# Patient Record
Sex: Female | Born: 1970 | Hispanic: Yes | Marital: Married | Smoking: Never smoker
Health system: Southern US, Community
[De-identification: ages and names within clinical notes are randomized; demographics above are authoritative.]

## PROBLEM LIST (undated history)

## (undated) DIAGNOSIS — Z83719 Family history of colon polyps, unspecified: Secondary | ICD-10-CM

## (undated) DIAGNOSIS — Z8371 Family history of colonic polyps: Secondary | ICD-10-CM

## (undated) DIAGNOSIS — B019 Varicella without complication: Secondary | ICD-10-CM

## (undated) DIAGNOSIS — E079 Disorder of thyroid, unspecified: Secondary | ICD-10-CM

## (undated) HISTORY — DX: Family history of colon polyps, unspecified: Z83.719

## (undated) HISTORY — PX: PELVIC LAPAROSCOPY: SHX162

## (undated) HISTORY — DX: Disorder of thyroid, unspecified: E07.9

## (undated) HISTORY — PX: OTHER SURGICAL HISTORY: SHX169

## (undated) HISTORY — DX: Family history of colonic polyps: Z83.71

## (undated) HISTORY — DX: Varicella without complication: B01.9

---

## 2001-05-21 HISTORY — PX: OTHER SURGICAL HISTORY: SHX169

## 2020-03-01 ENCOUNTER — Telehealth: Payer: Self-pay | Admitting: Internal Medicine

## 2020-03-01 NOTE — Telephone Encounter (Signed)
That is okay, please  be sure she knows needs a gynecologist

## 2020-03-01 NOTE — Telephone Encounter (Signed)
Please advise- you have seen her husband once back in June 2021.

## 2020-03-01 NOTE — Telephone Encounter (Signed)
States her husband Rayvon Char is your patient. She is wondering if she could see you

## 2020-03-24 DIAGNOSIS — Z23 Encounter for immunization: Secondary | ICD-10-CM | POA: Diagnosis not present

## 2020-04-03 DIAGNOSIS — Z20822 Contact with and (suspected) exposure to covid-19: Secondary | ICD-10-CM | POA: Diagnosis not present

## 2020-04-19 ENCOUNTER — Ambulatory Visit (INDEPENDENT_AMBULATORY_CARE_PROVIDER_SITE_OTHER): Payer: BC Managed Care – PPO | Admitting: Internal Medicine

## 2020-04-19 ENCOUNTER — Other Ambulatory Visit: Payer: Self-pay

## 2020-04-19 ENCOUNTER — Encounter: Payer: Self-pay | Admitting: Internal Medicine

## 2020-04-19 VITALS — BP 100/64 | HR 63 | Temp 98.3°F | Ht 69.5 in | Wt 152.8 lb

## 2020-04-19 DIAGNOSIS — Z0000 Encounter for general adult medical examination without abnormal findings: Secondary | ICD-10-CM | POA: Diagnosis not present

## 2020-04-19 DIAGNOSIS — Z23 Encounter for immunization: Secondary | ICD-10-CM | POA: Diagnosis not present

## 2020-04-19 DIAGNOSIS — Z01419 Encounter for gynecological examination (general) (routine) without abnormal findings: Secondary | ICD-10-CM

## 2020-04-19 DIAGNOSIS — Z1231 Encounter for screening mammogram for malignant neoplasm of breast: Secondary | ICD-10-CM

## 2020-04-19 DIAGNOSIS — E010 Iodine-deficiency related diffuse (endemic) goiter: Secondary | ICD-10-CM

## 2020-04-19 LAB — COMPREHENSIVE METABOLIC PANEL
ALT: 9 U/L (ref 0–35)
AST: 14 U/L (ref 0–37)
Albumin: 4.5 g/dL (ref 3.5–5.2)
Alkaline Phosphatase: 46 U/L (ref 39–117)
BUN: 14 mg/dL (ref 6–23)
CO2: 30 mEq/L (ref 19–32)
Calcium: 9.3 mg/dL (ref 8.4–10.5)
Chloride: 103 mEq/L (ref 96–112)
Creatinine, Ser: 0.77 mg/dL (ref 0.40–1.20)
GFR: 91.76 mL/min (ref >60.00)
Glucose, Bld: 80 mg/dL (ref 70–99)
Potassium: 4.1 mEq/L (ref 3.5–5.1)
Sodium: 138 mEq/L (ref 135–145)
Total Bilirubin: 0.7 mg/dL (ref 0.2–1.2)
Total Protein: 6.8 g/dL (ref 6.0–8.3)

## 2020-04-19 LAB — CBC WITH DIFFERENTIAL/PLATELET
Basophils Absolute: 0 K/uL (ref 0.0–0.1)
Basophils Relative: 0.9 % (ref 0.0–3.0)
Eosinophils Absolute: 0.3 K/uL (ref 0.0–0.7)
Eosinophils Relative: 5.1 % — ABNORMAL HIGH (ref 0.0–5.0)
HCT: 39.6 % (ref 36.0–46.0)
Hemoglobin: 13.1 g/dL (ref 12.0–15.0)
Lymphocytes Relative: 31 % (ref 12.0–46.0)
Lymphs Abs: 1.6 K/uL (ref 0.7–4.0)
MCHC: 33 g/dL (ref 30.0–36.0)
MCV: 92.3 fl (ref 78.0–100.0)
Monocytes Absolute: 0.4 K/uL (ref 0.1–1.0)
Monocytes Relative: 7.6 % (ref 3.0–12.0)
Neutro Abs: 2.9 K/uL (ref 1.4–7.7)
Neutrophils Relative %: 55.4 % (ref 43.0–77.0)
Platelets: 228 K/uL (ref 150.0–400.0)
RBC: 4.29 Mil/uL (ref 3.87–5.11)
RDW: 12.9 % (ref 11.5–15.5)
WBC: 5.3 K/uL (ref 4.0–10.5)

## 2020-04-19 LAB — LIPID PANEL
Cholesterol: 165 mg/dL (ref 0–200)
HDL: 71.7 mg/dL
LDL Cholesterol: 83 mg/dL (ref 0–99)
NonHDL: 93.78
Total CHOL/HDL Ratio: 2
Triglycerides: 56 mg/dL (ref 0.0–149.0)
VLDL: 11.2 mg/dL (ref 0.0–40.0)

## 2020-04-19 LAB — TSH: TSH: 0.71 u[IU]/mL (ref 0.35–4.50)

## 2020-04-19 NOTE — Patient Instructions (Addendum)
We are referring you to Dr. Edward Jolly   GO TO THE LAB : Get the blood work     GO TO THE FRONT DESK, PLEASE SCHEDULE YOUR APPOINTMENTS Come back for physical exam in 1 year   STOP BY THE FIRST FLOOR: You can schedule your mammogram

## 2020-04-19 NOTE — Progress Notes (Signed)
   Subjective:    Patient ID: Ruth Tucker, female    DOB: 1970-06-28, 49 y.o.   MRN: [id number]  DOS:  04/19/2020 Type of visit - description: New patient, CPX. Feels great, no major concerns.   Review of Systems  A 14 point review of systems is negative    Past Medical History:  Diagnosis Date  . FH: colon polyps   . Varicella     Past Surgical History:  Procedure Laterality Date  . adenitis mesenterica, 3 laparoscopies     3 surgeries ([place])  . CESAREAN SECTION     2005 and 2008  . uterine polyp removal ([place])      Allergies as of 04/19/2020   No Known Allergies     Medication List       Accurate as of April 19, 2020 11:59 PM. If you have any questions, ask your nurse or doctor.        MULTI-VITAMIN DAILY PO Take by mouth.   OSTEO BI-FLEX REGULAR STRENGTH PO Take by mouth in the morning and at bedtime.   PROBIOTIC-10 PO Take by mouth.          Objective:   Physical Exam BP 100/64 (BP Location: Right Arm, Patient Position: Sitting, Cuff Size: Large)   Pulse 63   Temp 98.3 F (36.8 C) (Oral)   Ht 5' 9.5" (1.765 m)   Wt 152 lb 12.8 oz (69.3 kg)   SpO2 100%   BMI 22.24 kg/m  General: Well developed, NAD, BMI noted Neck: Slightly increased thyroid size?  Not nodular, not tender. HEENT:  Normocephalic . Face symmetric, atraumatic Lungs:  CTA B Normal respiratory effort, no intercostal retractions, no accessory muscle use. Heart: RRR,  no murmur.  Abdomen:  Not distended, soft, non-tender. No rebound or rigidity.   Lower extremities: no pretibial edema bilaterally  Skin: Exposed areas without rash. Not pale. Not jaundice Neurologic:  alert & oriented X3.  Speech normal, gait appropriate for age and unassisted Strength symmetric and appropriate for age.  Psych: Cognition and judgment appear intact.  Cooperative with normal attention span and concentration.  Behavior appropriate. No anxious or depressed appearing.       Assessment    Assessment (new patient 03-2020) Healthy, not on BCP  Plan: New patient, here for CPX. Thyromegaly on exam?:  Check ultrasound Social: Originally from [place], has lived in many countries, moved to [place] 2020 with her family RTC 1 year     This visit occurred during the [employer] public health emergency.  Safety protocols were in place, including screening questions prior to the visit, additional usage of staff PPE, and extensive cleaning of exam room while observing appropriate contact time as indicated for disinfecting solutions.

## 2020-04-20 ENCOUNTER — Encounter: Payer: Self-pay | Admitting: Internal Medicine

## 2020-04-20 DIAGNOSIS — Z0000 Encounter for general adult medical examination without abnormal findings: Secondary | ICD-10-CM | POA: Insufficient documentation

## 2020-04-20 NOTE — Assessment & Plan Note (Signed)
Tdap: Today Had COVID vaccination including a booster Had a flu shot Refer to gynecology, Dr. Edward Jolly, order mammogram (last MMG 12-2018) CCS: Had colonoscopy in 2016 while she was living in [place], it was done after infection (colitis?),  Was told no polyps.  She has 1 family member with colon cancer (aunt).  Reassess next year Labs: CMP, CBC, FLP, TSH Lifestyle is very healthy, exercise 5 times a week

## 2020-04-22 ENCOUNTER — Ambulatory Visit (HOSPITAL_BASED_OUTPATIENT_CLINIC_OR_DEPARTMENT_OTHER)
Admission: RE | Admit: 2020-04-22 | Discharge: 2020-04-22 | Disposition: A | Discharge status: Home / Self Care | Payer: BC Managed Care – PPO | Source: Ambulatory Visit | Attending: Internal Medicine | Admitting: Internal Medicine

## 2020-04-22 ENCOUNTER — Other Ambulatory Visit: Payer: Self-pay

## 2020-04-22 DIAGNOSIS — E010 Iodine-deficiency related diffuse (endemic) goiter: Secondary | ICD-10-CM | POA: Insufficient documentation

## 2020-05-30 DIAGNOSIS — U071 COVID-19: Secondary | ICD-10-CM

## 2020-05-30 HISTORY — DX: COVID-19: U07.1

## 2020-07-18 DIAGNOSIS — M9903 Segmental and somatic dysfunction of lumbar region: Secondary | ICD-10-CM | POA: Diagnosis not present

## 2020-07-18 DIAGNOSIS — M9902 Segmental and somatic dysfunction of thoracic region: Secondary | ICD-10-CM | POA: Diagnosis not present

## 2020-07-18 DIAGNOSIS — M5386 Other specified dorsopathies, lumbar region: Secondary | ICD-10-CM | POA: Diagnosis not present

## 2020-07-18 DIAGNOSIS — M9905 Segmental and somatic dysfunction of pelvic region: Secondary | ICD-10-CM | POA: Diagnosis not present

## 2020-07-20 ENCOUNTER — Ambulatory Visit: Payer: BC Managed Care – PPO | Admitting: Obstetrics and Gynecology

## 2020-07-27 DIAGNOSIS — S90852A Superficial foreign body, left foot, initial encounter: Secondary | ICD-10-CM | POA: Diagnosis not present

## 2020-07-27 DIAGNOSIS — M7752 Other enthesopathy of left foot: Secondary | ICD-10-CM | POA: Diagnosis not present

## 2020-07-27 DIAGNOSIS — M19072 Primary osteoarthritis, left ankle and foot: Secondary | ICD-10-CM | POA: Diagnosis not present

## 2020-08-26 ENCOUNTER — Encounter: Payer: Self-pay | Admitting: Internal Medicine

## 2020-09-28 ENCOUNTER — Encounter: Payer: Self-pay | Admitting: Obstetrics and Gynecology

## 2020-09-28 ENCOUNTER — Other Ambulatory Visit: Payer: Self-pay

## 2020-09-28 ENCOUNTER — Ambulatory Visit (INDEPENDENT_AMBULATORY_CARE_PROVIDER_SITE_OTHER): Payer: BC Managed Care – PPO | Admitting: Obstetrics and Gynecology

## 2020-09-28 VITALS — BP 108/68 | HR 78 | Ht 69.0 in | Wt 159.0 lb

## 2020-09-28 DIAGNOSIS — Z01419 Encounter for gynecological examination (general) (routine) without abnormal findings: Secondary | ICD-10-CM

## 2020-09-28 NOTE — Patient Instructions (Signed)

## 2020-09-28 NOTE — Progress Notes (Signed)
50 y.o. [id number] Married [place] female here for annual exam.    Periods are irregular but does occur once a month. Flow is more strong.  Some hot flashes and not sleeping well for many, many years. Not requesting any treatment.   Sometimes has yeast infections, increased with particular dietary intake.  Covid in January.  Will do second booster.   Having knee pain.  Dances.   From [place]. Children 47 and 64 yo.  Husband works for [employer] in [place].  PCP: Willow Ora, MD  Patient's last menstrual period was 08/30/2020 (exact date).           Sexually active: Yes.    The current method of family planning is none.    Exercising: Yes.    ballet and BAR Smoker:  no  Health Maintenance: Pap: 01/2019 normal with negative HR HPV in [place] History of abnormal Pap:  no MMG:  01/2019 normal in [place] Colonoscopy:  2012 done to r/o colitis BMD:   n/a  Result  n/a TDaP:  04-19-20 Gardasil:   no HIV: Neg in preg Hep C:Unsure Screening Labs:  PCP   reports that she has never smoked. She has never used smokeless tobacco. She reports current alcohol use of about 4.0 - 6.0 standard drinks of alcohol per week. She reports that she does not use drugs.  Past Medical History:  Diagnosis Date  . COVID 05/30/2020  . FH: colon polyps   . Thyroid disease    hx enlarged thyroid--US normal  . Varicella     Past Surgical History:  Procedure Laterality Date  . adenitis mesenterica, 3 laparoscopies  2003   3 surgeries ([place])--  . CESAREAN SECTION     2005 and 2008  . PELVIC LAPAROSCOPY    . uterine polyp removal ([place])      Current Outpatient Medications  Medication Sig Dispense Refill  . chlorhexidine (PERIDEX) 0.12 % solution SMARTSIG:By Mouth    . Glucosamine-Chondroitin (OSTEO BI-FLEX REGULAR STRENGTH PO) Take by mouth in the morning and at bedtime.    . Multiple Vitamin (MULTI-VITAMIN DAILY PO) Take by mouth.    . Probiotic Product (PROBIOTIC-10 PO) Take by  mouth.     No current facility-administered medications for this visit.    Family History  Problem Relation Age of Onset  . Depression Mother   . Hypertension Mother   . Asthma Sister   . Hypertension Brother   . Hearing loss Son   . Cancer Maternal Grandmother   . Hypertension Maternal Grandmother   . Cancer Maternal Grandfather   . Hypertension Maternal Grandfather   . Cancer Paternal Grandfather   . Hypertension Paternal Grandfather   . Colon cancer Other        aunt   . Breast cancer Other        aunt  . Cancer Other        Ovarian ca  . Ovarian cancer Other   . Colon cancer Maternal Aunt   . Breast cancer Paternal Aunt   . Colon cancer Paternal Aunt   . Hypertension Paternal Grandmother     Review of Systems  All other systems reviewed and are negative.   Exam:   BP 108/68   Pulse 78   Ht 5\' 9"  (1.753 m)   Wt 159 lb (72.1 kg)   LMP 08/30/2020 (Exact Date)   SpO2 99%   BMI 23.48 kg/m     General appearance: alert, cooperative and appears stated age Head: normocephalic, without  obvious abnormality, atraumatic Neck: no adenopathy, supple, symmetrical, trachea midline and thyroid normal to inspection and palpation Lungs: clear to auscultation bilaterally Breasts: normal appearance, no masses or tenderness, No nipple retraction or dimpling, No nipple discharge or bleeding, No axillary adenopathy Heart: regular rate and rhythm Abdomen: soft, non-tender; no masses, no organomegaly Extremities: extremities normal, atraumatic, no cyanosis or edema Skin: skin color, texture, turgor normal. No rashes or lesions Lymph nodes: cervical, supraclavicular, and axillary nodes normal. Neurologic: grossly normal  Pelvic: External genitalia:  no lesions              No abnormal inguinal nodes palpated.              Urethra:  normal appearing urethra with no masses, tenderness or lesions              Bartholins and Skenes: normal                 Vagina: normal appearing  vagina with normal color and discharge, no lesions              Cervix: no lesions              Pap taken: No. Bimanual Exam:  Uterus:  normal size, contour, position, consistency, mobility, non-tender              Adnexa: no mass, fullness, tenderness              Rectal exam: Yes.  .  Confirms.              Anus:  normal sphincter tone, no lesions  Chaperone was present for exam.  Assessment:   Well woman visit with normal exam.  Plan: Mammogram screening recommended.  Contact information for locations in [place] sent to the patient through My Chart following the visit.  Self breast awareness reviewed. Pap and HR HPV as above. Guidelines for Calcium, Vitamin D, regular exercise program including cardiovascular and weight bearing exercise.   Follow up annually and prn.

## 2020-09-29 ENCOUNTER — Encounter: Payer: Self-pay | Admitting: Obstetrics and Gynecology

## 2020-09-29 NOTE — Telephone Encounter (Signed)
Hello Ruth Tucker,   It was pleasure to meet you yesterday!  Welcome to [facility].   I am reaching out to give you information where you can schedule your mammogram in [place].  Both facilities are located on [address] near [facility]. - [facility]:  [phone].  [facility]:  [phone].  Have a great day.  Conley Simmonds, MD

## 2020-11-25 DIAGNOSIS — M9902 Segmental and somatic dysfunction of thoracic region: Secondary | ICD-10-CM | POA: Diagnosis not present

## 2020-11-25 DIAGNOSIS — M9903 Segmental and somatic dysfunction of lumbar region: Secondary | ICD-10-CM | POA: Diagnosis not present

## 2020-11-25 DIAGNOSIS — M5386 Other specified dorsopathies, lumbar region: Secondary | ICD-10-CM | POA: Diagnosis not present

## 2020-11-25 DIAGNOSIS — M9905 Segmental and somatic dysfunction of pelvic region: Secondary | ICD-10-CM | POA: Diagnosis not present

## 2020-11-28 DIAGNOSIS — M9905 Segmental and somatic dysfunction of pelvic region: Secondary | ICD-10-CM | POA: Diagnosis not present

## 2020-11-28 DIAGNOSIS — M9902 Segmental and somatic dysfunction of thoracic region: Secondary | ICD-10-CM | POA: Diagnosis not present

## 2020-11-28 DIAGNOSIS — M9903 Segmental and somatic dysfunction of lumbar region: Secondary | ICD-10-CM | POA: Diagnosis not present

## 2020-11-28 DIAGNOSIS — M5386 Other specified dorsopathies, lumbar region: Secondary | ICD-10-CM | POA: Diagnosis not present

## 2020-12-19 DIAGNOSIS — M5386 Other specified dorsopathies, lumbar region: Secondary | ICD-10-CM | POA: Diagnosis not present

## 2020-12-19 DIAGNOSIS — M9902 Segmental and somatic dysfunction of thoracic region: Secondary | ICD-10-CM | POA: Diagnosis not present

## 2020-12-19 DIAGNOSIS — M9905 Segmental and somatic dysfunction of pelvic region: Secondary | ICD-10-CM | POA: Diagnosis not present

## 2020-12-19 DIAGNOSIS — M9903 Segmental and somatic dysfunction of lumbar region: Secondary | ICD-10-CM | POA: Diagnosis not present

## 2020-12-21 DIAGNOSIS — M5386 Other specified dorsopathies, lumbar region: Secondary | ICD-10-CM | POA: Diagnosis not present

## 2020-12-21 DIAGNOSIS — M9902 Segmental and somatic dysfunction of thoracic region: Secondary | ICD-10-CM | POA: Diagnosis not present

## 2020-12-21 DIAGNOSIS — M9903 Segmental and somatic dysfunction of lumbar region: Secondary | ICD-10-CM | POA: Diagnosis not present

## 2020-12-21 DIAGNOSIS — M9905 Segmental and somatic dysfunction of pelvic region: Secondary | ICD-10-CM | POA: Diagnosis not present

## 2021-01-31 DIAGNOSIS — M9902 Segmental and somatic dysfunction of thoracic region: Secondary | ICD-10-CM | POA: Diagnosis not present

## 2021-01-31 DIAGNOSIS — M9903 Segmental and somatic dysfunction of lumbar region: Secondary | ICD-10-CM | POA: Diagnosis not present

## 2021-01-31 DIAGNOSIS — M5386 Other specified dorsopathies, lumbar region: Secondary | ICD-10-CM | POA: Diagnosis not present

## 2021-01-31 DIAGNOSIS — M9905 Segmental and somatic dysfunction of pelvic region: Secondary | ICD-10-CM | POA: Diagnosis not present

## 2021-02-03 DIAGNOSIS — M9903 Segmental and somatic dysfunction of lumbar region: Secondary | ICD-10-CM | POA: Diagnosis not present

## 2021-02-03 DIAGNOSIS — M9902 Segmental and somatic dysfunction of thoracic region: Secondary | ICD-10-CM | POA: Diagnosis not present

## 2021-02-03 DIAGNOSIS — M5386 Other specified dorsopathies, lumbar region: Secondary | ICD-10-CM | POA: Diagnosis not present

## 2021-02-03 DIAGNOSIS — M9905 Segmental and somatic dysfunction of pelvic region: Secondary | ICD-10-CM | POA: Diagnosis not present

## 2021-04-14 DIAGNOSIS — M5386 Other specified dorsopathies, lumbar region: Secondary | ICD-10-CM | POA: Diagnosis not present

## 2021-04-14 DIAGNOSIS — M9903 Segmental and somatic dysfunction of lumbar region: Secondary | ICD-10-CM | POA: Diagnosis not present

## 2021-04-14 DIAGNOSIS — M9902 Segmental and somatic dysfunction of thoracic region: Secondary | ICD-10-CM | POA: Diagnosis not present

## 2021-04-14 DIAGNOSIS — M9905 Segmental and somatic dysfunction of pelvic region: Secondary | ICD-10-CM | POA: Diagnosis not present

## 2021-04-18 DIAGNOSIS — B009 Herpesviral infection, unspecified: Secondary | ICD-10-CM | POA: Insufficient documentation

## 2021-04-20 ENCOUNTER — Encounter: Payer: Self-pay | Admitting: Internal Medicine

## 2021-04-20 ENCOUNTER — Ambulatory Visit (INDEPENDENT_AMBULATORY_CARE_PROVIDER_SITE_OTHER): Payer: BC Managed Care – PPO | Admitting: Internal Medicine

## 2021-04-20 VITALS — BP 106/68 | HR 65 | Temp 98.0°F | Resp 16 | Ht 69.0 in | Wt 153.2 lb

## 2021-04-20 DIAGNOSIS — Z1231 Encounter for screening mammogram for malignant neoplasm of breast: Secondary | ICD-10-CM | POA: Diagnosis not present

## 2021-04-20 DIAGNOSIS — Z09 Encounter for follow-up examination after completed treatment for conditions other than malignant neoplasm: Secondary | ICD-10-CM | POA: Insufficient documentation

## 2021-04-20 DIAGNOSIS — Z0000 Encounter for general adult medical examination without abnormal findings: Secondary | ICD-10-CM | POA: Diagnosis not present

## 2021-04-20 DIAGNOSIS — Z1159 Encounter for screening for other viral diseases: Secondary | ICD-10-CM

## 2021-04-20 DIAGNOSIS — K5909 Other constipation: Secondary | ICD-10-CM

## 2021-04-20 DIAGNOSIS — Z114 Encounter for screening for human immunodeficiency virus [HIV]: Secondary | ICD-10-CM

## 2021-04-20 LAB — CBC WITH DIFFERENTIAL/PLATELET
Basophils Absolute: 0.1 10*3/uL (ref 0.0–0.1)
Basophils Relative: 1.3 % (ref 0.0–3.0)
Eosinophils Absolute: 0.2 10*3/uL (ref 0.0–0.7)
Eosinophils Relative: 4.4 % (ref 0.0–5.0)
HCT: 38.4 % (ref 36.0–46.0)
Hemoglobin: 12.6 g/dL (ref 12.0–15.0)
Lymphocytes Relative: 35.7 % (ref 12.0–46.0)
Lymphs Abs: 1.6 10*3/uL (ref 0.7–4.0)
MCHC: 32.8 g/dL (ref 30.0–36.0)
MCV: 92.4 fl (ref 78.0–100.0)
Monocytes Absolute: 0.4 10*3/uL (ref 0.1–1.0)
Monocytes Relative: 8.9 % (ref 3.0–12.0)
Neutro Abs: 2.2 10*3/uL (ref 1.4–7.7)
Neutrophils Relative %: 49.7 % (ref 43.0–77.0)
Platelets: 234 10*3/uL (ref 150.0–400.0)
RBC: 4.16 Mil/uL (ref 3.87–5.11)
RDW: 12.4 % (ref 11.5–15.5)
WBC: 4.4 10*3/uL (ref 4.0–10.5)

## 2021-04-20 LAB — LIPID PANEL
Cholesterol: 190 mg/dL (ref 0–200)
HDL: 76.3 mg/dL (ref >39.00)
LDL Cholesterol: 106 mg/dL — ABNORMAL HIGH (ref 0–99)
NonHDL: 113.4
Total CHOL/HDL Ratio: 2
Triglycerides: 36 mg/dL (ref 0.0–149.0)
VLDL: 7.2 mg/dL (ref 0.0–40.0)

## 2021-04-20 LAB — COMPREHENSIVE METABOLIC PANEL
ALT: 10 U/L (ref 0–35)
AST: 18 U/L (ref 0–37)
Albumin: 4.5 g/dL (ref 3.5–5.2)
Alkaline Phosphatase: 41 U/L (ref 39–117)
BUN: 11 mg/dL (ref 6–23)
CO2: 27 mEq/L (ref 19–32)
Calcium: 9.3 mg/dL (ref 8.4–10.5)
Chloride: 103 mEq/L (ref 96–112)
Creatinine, Ser: 0.8 mg/dL (ref 0.40–1.20)
GFR: 87.03 mL/min (ref >60.00)
Glucose, Bld: 84 mg/dL (ref 70–99)
Potassium: 4.2 mEq/L (ref 3.5–5.1)
Sodium: 136 mEq/L (ref 135–145)
Total Bilirubin: 0.6 mg/dL (ref 0.2–1.2)
Total Protein: 6.8 g/dL (ref 6.0–8.3)

## 2021-04-20 LAB — HIV ANTIBODY (ROUTINE TESTING W REFLEX): HIV 1&2 Ab, 4th Generation: NONREACTIVE

## 2021-04-20 LAB — TSH: TSH: 0.81 u[IU]/mL (ref 0.35–5.50)

## 2021-04-20 LAB — HEPATITIS C ANTIBODY
Hepatitis C Ab: NONREACTIVE
SIGNAL TO CUT-OFF: 0.02 (ref <1.00)

## 2021-04-20 NOTE — Assessment & Plan Note (Signed)
Here for CPX. Chronic constipation: As described above, refer to GI. LLQ abdominal pain: Occasionally has LLQ abdominal discomfort, not related to her periods, exam is benign, recommend observation, if sxs persist, pelvic ultrasound? RTC 1 year

## 2021-04-20 NOTE — Progress Notes (Signed)
Subjective:    Patient ID: Ruth Tucker, female    DOB: 1971/03/20, 50 y.o.   MRN: [id number]  DOS:  04/20/2021 Type of visit - description: cpx  Here for CPX. She has a history of chronic constipation, takes OTC fiber , it used to work better for her. Currently having 3 BMs weekly. Denies nausea, vomiting.  No diarrhea.  No blood in the stools. Occasionally has ill-defined LLQ abdominal pain. Occasionally feels bloated.  Also, she remains very active, having aches and pains after exercise.  Review of Systems  Other than above, a 14 point review of systems is negative       Past Medical History:  Diagnosis Date   COVID 05/30/2020   FH: colon polyps    Thyroid disease    hx enlarged thyroid--US normal   Varicella     Past Surgical History:  Procedure Laterality Date   adenitis mesenterica, 3 laparoscopies  2003   3 surgeries ([place])--   CESAREAN SECTION     2005 and 2008   PELVIC LAPAROSCOPY     uterine polyp removal ([place])     Social History   Socioeconomic History   Marital status: Married    Spouse name: Not on file   Number of children: 2   Years of education: Not on file   Highest education level: Not on file  Occupational History   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  Tobacco Use   Smoking status: Never   Smokeless tobacco: Never  Substance and Sexual Activity   Alcohol use: Yes    Alcohol/week: 4.0 - 6.0 standard drinks    Types: 4 - 6 Glasses of wine per week    Comment: moderate   Drug use: Never   Sexual activity: Yes    Birth control/protection: None  Other Topics Concern   Not on file  Social History Narrative   Original from [place], moved to [place] from [place] 2020   Social Determinants of Health   Financial Resource Strain: Not on file  Food Insecurity: Not on file  Transportation Needs: Not on file  Physical Activity: Not on file  Stress: Not on file  Social Connections: Not on file  Intimate Partner Violence: Not on file     Allergies as of 04/20/2021   No Known Allergies      Medication List        Accurate as of April 20, 2021  5:30 PM. If you have any questions, ask your nurse or doctor.          chlorhexidine 0.12 % solution Commonly known as: PERIDEX SMARTSIG:By Mouth   COLLAGEN PO Take by mouth.   MULTI-VITAMIN DAILY PO Take by mouth.   OSTEO BI-FLEX REGULAR STRENGTH PO Take by mouth in the morning and at bedtime.   PROBIOTIC-10 PO Take by mouth.   psyllium 0.52 g capsule Commonly known as: REGULOID Take 0.52 g by mouth daily.           Objective:   Physical Exam BP 106/68 (BP Location: Left Arm, Patient Position: Sitting, Cuff Size: Small)   Pulse 65   Temp 98 F (36.7 C) (Oral)   Resp 16   Ht 5\' 9"  (1.753 m)   Wt 153 lb 4 oz (69.5 kg)   SpO2 98%   BMI 22.63 kg/m  General: Well developed, NAD, BMI noted Neck: No  thyromegaly  HEENT:  Normocephalic . Face symmetric, atraumatic Lungs:  CTA B Normal respiratory effort, no intercostal retractions,  no accessory muscle use. Heart: RRR,  no murmur.  Abdomen:  Not distended, soft, non-tender. No rebound or rigidity.   Lower extremities: no pretibial edema bilaterally Upper extremities: Hands and wrists without synovitis Skin: Exposed areas without rash. Not pale. Not jaundice Neurologic:  alert & oriented X3.  Speech normal, gait appropriate for age and unassisted Strength symmetric and appropriate for age.  Psych: Cognition and judgment appear intact.  Cooperative with normal attention span and concentration.  Behavior appropriate. No anxious or depressed appearing.     Assessment     Assessment (new patient 03-2020) Healthy, not on BCP US Thyroid 04-2020 wnl   PLAN Here for CPX. Chronic constipation: As described above, refer to GI. LLQ abdominal pain: Occasionally has LLQ abdominal discomfort, not related to her periods, exam is benign, recommend observation, if sxs persist, pelvic  ultrasound? RTC 1 year    This visit occurred during the SARS-CoV-2 public health emergency.  Safety protocols were in place, including screening questions prior to the visit, additional usage of staff PPE, and extensive cleaning of exam room while observing appropriate contact time as indicated for disinfecting solutions.

## 2021-04-20 NOTE — Assessment & Plan Note (Signed)
Tdap: 03-2020  COVID vac booster rec Had a flu shot  Saw gyn  Dr. Edward Jolly, she noted  a PAP was done  2020 in [place]  Last MMG 12-2018 in [place] , rx  a  MMG  CCS: Had colonoscopy in 2016 while she was living in [place], it was done after infection (colitis?),  Was told no polyps.  She has 1 family member with colon cancer (aunt).  Today also complains of chronic constipation.  Refer to GI. Labs: CMP, FLP, CBC, TSH, HIV, hep C Lifestyle is very healthy, exercise 5 times a week

## 2021-04-20 NOTE — Patient Instructions (Signed)
Please call the gastroenterology office at [phone] and arrange a office visit with them in regards.  Constipation.   GO TO THE LAB : Get the blood work     GO TO THE FRONT DESK, PLEASE SCHEDULE YOUR APPOINTMENTS Come back for a physical exam in 1 year   STOP BY THE FIRST FLOOR: Schedule a mammogram

## 2021-04-21 ENCOUNTER — Encounter: Payer: Self-pay | Admitting: Internal Medicine

## 2021-04-21 ENCOUNTER — Ambulatory Visit (INDEPENDENT_AMBULATORY_CARE_PROVIDER_SITE_OTHER): Payer: BC Managed Care – PPO | Admitting: Internal Medicine

## 2021-04-21 VITALS — BP 102/62 | HR 62 | Ht 69.0 in | Wt 153.0 lb

## 2021-04-21 DIAGNOSIS — K5900 Constipation, unspecified: Secondary | ICD-10-CM | POA: Diagnosis not present

## 2021-04-21 DIAGNOSIS — R1030 Lower abdominal pain, unspecified: Secondary | ICD-10-CM

## 2021-04-21 DIAGNOSIS — Z1211 Encounter for screening for malignant neoplasm of colon: Secondary | ICD-10-CM | POA: Diagnosis not present

## 2021-04-21 MED ORDER — SUTAB 1479-225-188 MG PO TABS: 1.0000 | ORAL_TABLET | Freq: Once | ORAL | 0 refills | Status: AC

## 2021-04-21 NOTE — Patient Instructions (Signed)
If you are age 48 or older, your body mass index should be between 23-30. Your Body mass index is 22.59 kg/m. If this is out of the aforementioned range listed, please consider follow up with your Primary Care Provider.  If you are age 47 or younger, your body mass index should be between 19-25. Your Body mass index is 22.59 kg/m. If this is out of the aformentioned range listed, please consider follow up with your Primary Care Provider.   ________________________________________________________  The [facility] providers would like to encourage you to use [facility] to communicate with providers for non-urgent requests or questions.  Due to long hold times on the telephone, sending your provider a message by [facility] may be a faster and more efficient way to get a response.  Please allow 48 business hours for a response.  Please remember that this is for non-urgent requests.  _______________________________________________________  Ruth Tucker have been scheduled for a colonoscopy. Please follow written instructions given to you at your visit today.  Please pick up your prep supplies at the pharmacy within the next 1-3 days. If you use inhalers (even only as needed), please bring them with you on the day of your procedure.   Restart fiber  Take Miralax daily - titrate up to achieve one bowel movement a day

## 2021-04-21 NOTE — Progress Notes (Signed)
 Chief Complaint: Constipation  HPI : 50 year old with history of chronic constipation presents for consitpatoin.   She has been having issues with constipation for years. She was taking psyllium husk, which was helping previously. However, more recently her fiber supplement has not been helping with her constipation as much. She is on average having 3 stools per week. Denies having tried anything else for her constipation. Denies laxative use. Has been having issues with abdominal pain that is located in the LLQ. This pain appears to improve after having a BM. On the average a few times per years she has had issues with hemorrhoids. She ususually uses suppositories for hemorrhoids and over a few days her hemorrhoids resolve.  Denies hematochezia, melena. Has fam hx of colon cancer in a maternal aunt. She has had a colonoscopy in the past in 2016 when she was in [place] when she was seen to have colonic inflammation that was treated with antibiotics.  Denies dysphagia, N&V. She has occasional bloating. Denies chest burning, regurgitation.She drinks alcohol twice per week.  She is from [place] originally.   Past Medical History:  Diagnosis Date   COVID 05/30/2020   FH: colon polyps    Thyroid disease    hx enlarged thyroid--US normal   Varicella      Past Surgical History:  Procedure Laterality Date   adenitis mesenterica, 3 laparoscopies  2003   3 surgeries ([place])--   CESAREAN SECTION     2005 and 2008   PELVIC LAPAROSCOPY     uterine polyp removal ([place])     Family History  Problem Relation Age of Onset   Depression Mother    Hypertension Mother    Asthma Sister    Hypertension Brother    Hearing loss Son    Cancer Maternal Grandmother    Hypertension Maternal Grandmother    Cancer Maternal Grandfather    Hypertension Maternal Grandfather    Cancer Paternal Grandfather    Hypertension Paternal Grandfather    Colon cancer Other        aunt    Breast cancer Other         aunt   Cancer Other        Ovarian ca   Ovarian cancer Other    Colon cancer Maternal Aunt    Breast cancer Paternal Aunt    Colon cancer Paternal Aunt    Hypertension Paternal Grandmother    Social History   Tobacco Use   Smoking status: Never   Smokeless tobacco: Never  Substance Use Topics   Alcohol use: Yes    Alcohol/week: 4.0 - 6.0 standard drinks    Types: 4 - 6 Glasses of wine per week    Comment: moderate   Drug use: Never   Current Outpatient Medications  Medication Sig Dispense Refill   chlorhexidine (PERIDEX) 0.12 % solution SMARTSIG:By Mouth     COLLAGEN PO Take by mouth.     Glucosamine-Chondroitin (OSTEO BI-FLEX REGULAR STRENGTH PO) Take by mouth in the morning and at bedtime.     Multiple Vitamin (MULTI-VITAMIN DAILY PO) Take by mouth.     Probiotic Product (PROBIOTIC-10 PO) Take by mouth.     psyllium (REGULOID) 0.52 g capsule Take 0.52 g by mouth daily.     Sodium Sulfate-Mag Sulfate-KCl (SUTAB) 1479-225-188 MG TABS Take 1 kit by mouth once for 1 dose. 24 tablet 0   No current facility-administered medications for this visit.   No Known Allergies   Review of   Systems: All systems reviewed and negative except where noted in HPI.   Physical Exam: BP 102/62   Pulse 62   Ht 5' 9" (1.753 m)   Wt 153 lb (69.4 kg)   BMI 22.59 kg/m  Constitutional: Pleasant,well-developed, female in no acute distress. HEENT: Normocephalic and atraumatic. Conjunctivae are normal. No scleral icterus. Cardiovascular: Normal rate, regular rhythm.  Pulmonary/chest: Effort normal and breath sounds normal. No wheezing, rales or rhonchi. Abdominal: Soft, nondistended, tender in the LLQ. Bowel sounds active throughout. There are no masses palpable. No hepatomegaly. Extremities: No edema Neurological: Alert and oriented to person place and time. Skin: Skin is warm and dry. No rashes noted. Psychiatric: Normal mood and affect. Behavior is normal.  Labs 04/20/21: CBC and CMP  normal. HCV NR. TSH nml.  ASSESSMENT AND PLAN:  Constipation Lower abdominal pain  Colon cancer screening Patient presents with longstanding constipation that appears to be worsening over time. Her constipation used to be adequately controlled by her fiber supplement but has stopped by as responsive to fiber. We will plan to restart her fiber supplement and start her on MiraLAX daily to try and help with her symptoms.  Since the patient is also due for colon cancer screening, we will plan for colonoscopy for further evaluation.  She had a previous colonoscopy done in [place] that reportedly showed some inflammation but was otherwise unremarkable.  She does have some second-degree family members that have colon cancer. - Restart psyllium once a day - Miralax QD. Titrate to achieve 1 BM per day - Colonoscopy [facility] - RTC 2 months. Can consider Linzess in the future  Claire Dorsey, MD  

## 2021-04-24 ENCOUNTER — Ambulatory Visit (HOSPITAL_BASED_OUTPATIENT_CLINIC_OR_DEPARTMENT_OTHER): Payer: BC Managed Care – PPO

## 2021-04-25 ENCOUNTER — Encounter: Payer: Self-pay | Admitting: Internal Medicine

## 2021-04-25 ENCOUNTER — Telehealth: Payer: Self-pay | Admitting: Internal Medicine

## 2021-04-25 IMAGING — US US THYROID
1 series · 14 of 19 positions shown · non-contrast
Comparison: None.

CLINICAL DATA: Thyromegaly

EXAM:
THYROID ULTRASOUND
TECHNIQUE: Ultrasound examination of the thyroid gland and adjacent soft
tissues was performed.

[Series 1: us thyroid · 14 of 19 slices shown]
[im 1/19]
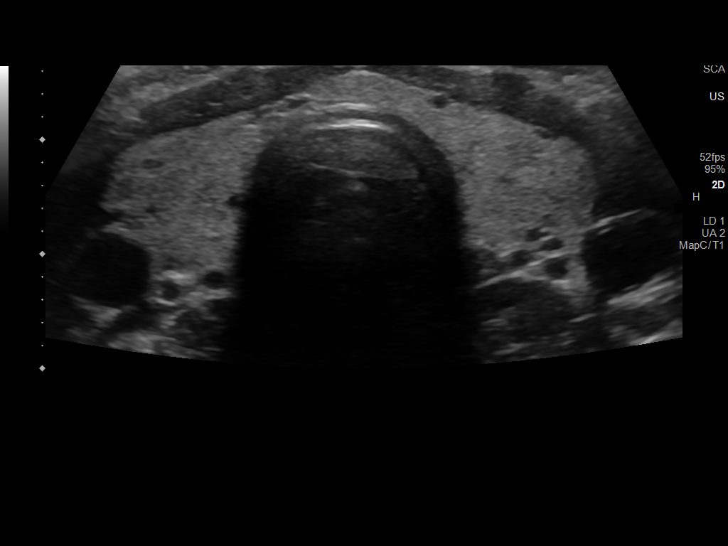
[im 3/19]
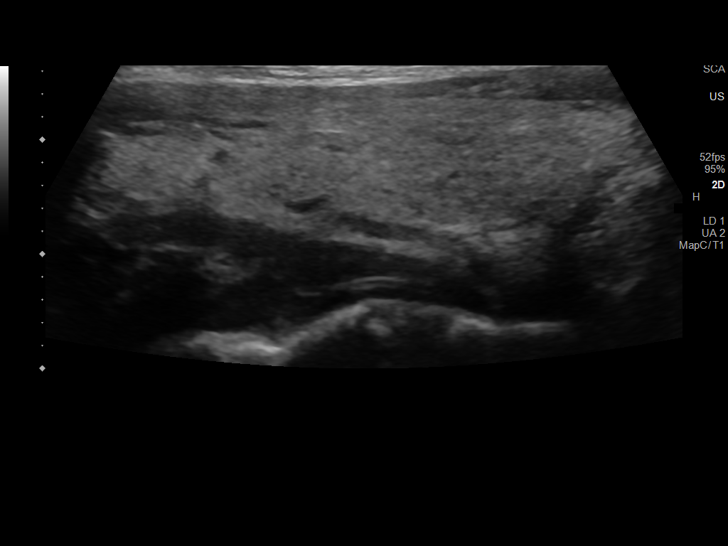
[im 4/19]
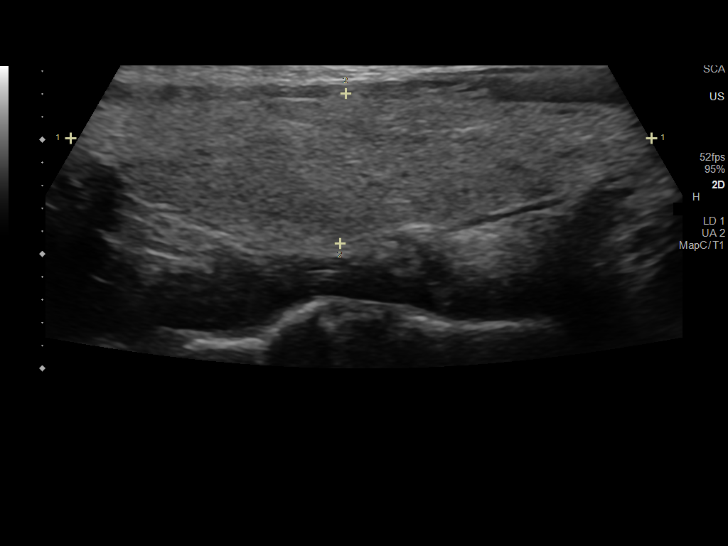
[im 5/19]
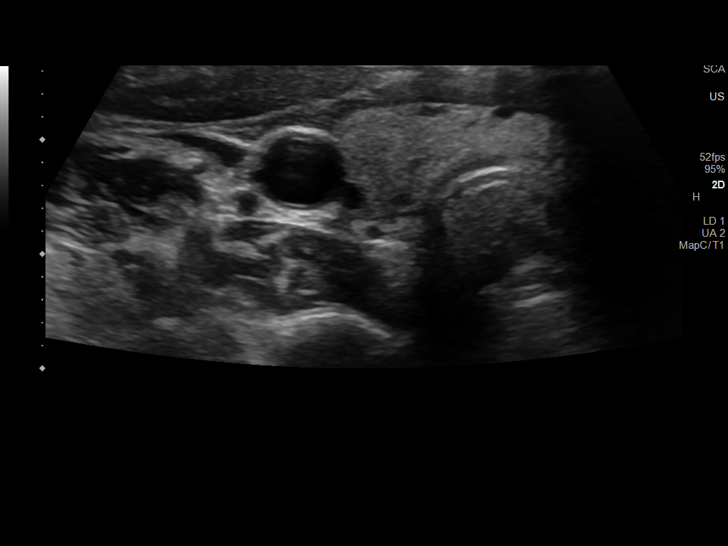
[im 7/19]
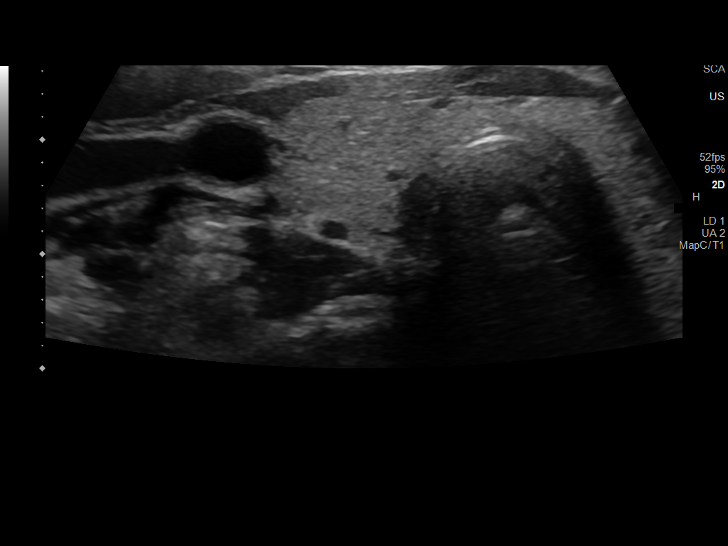
[im 8/19]
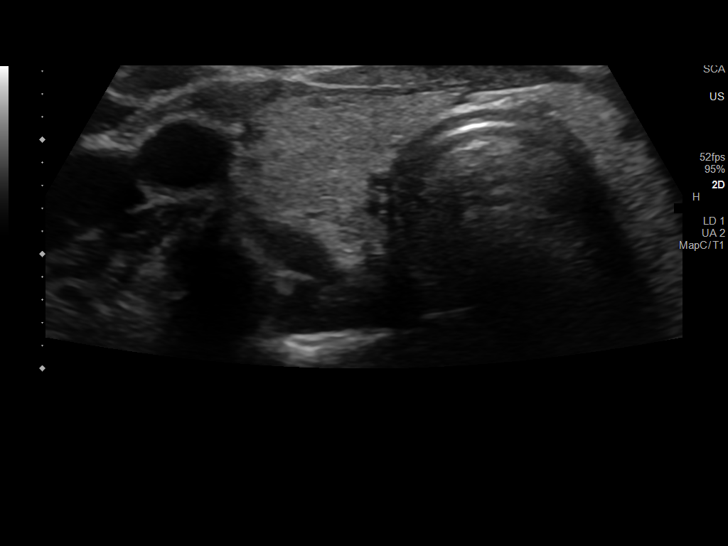
[im 9/19]
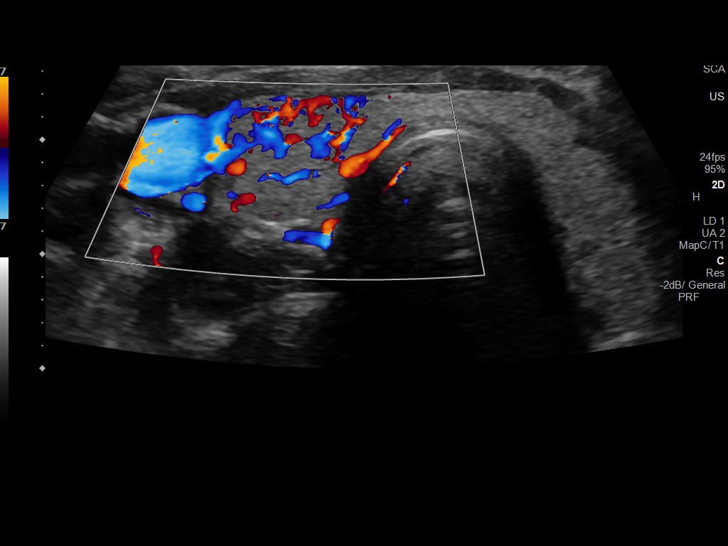
[im 11/19]
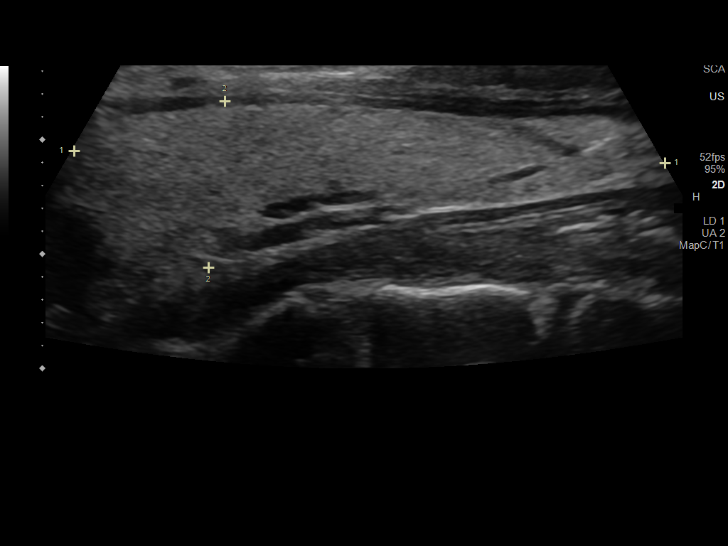
[im 12/19]
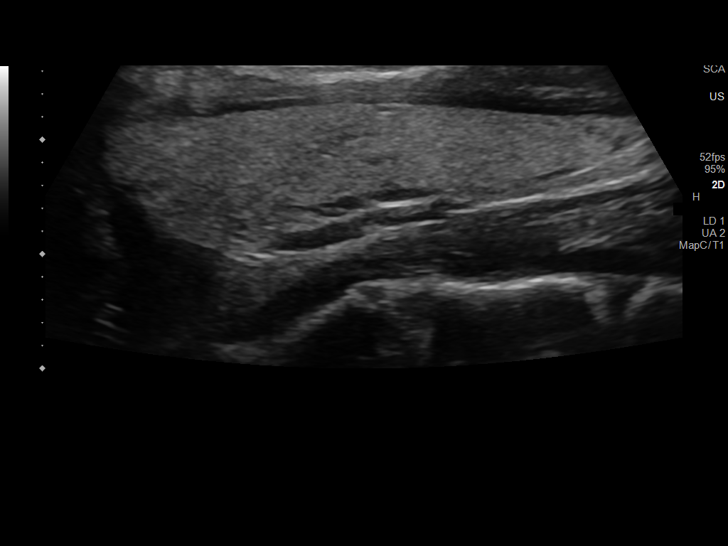
[im 13/19]
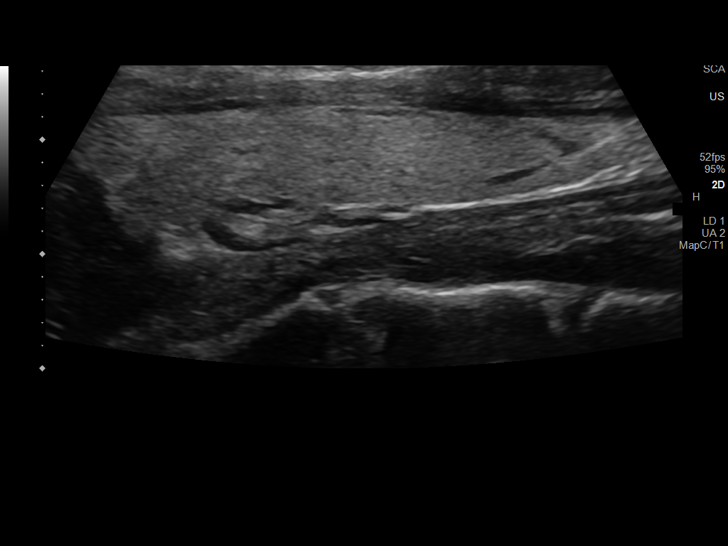
[im 15/19]
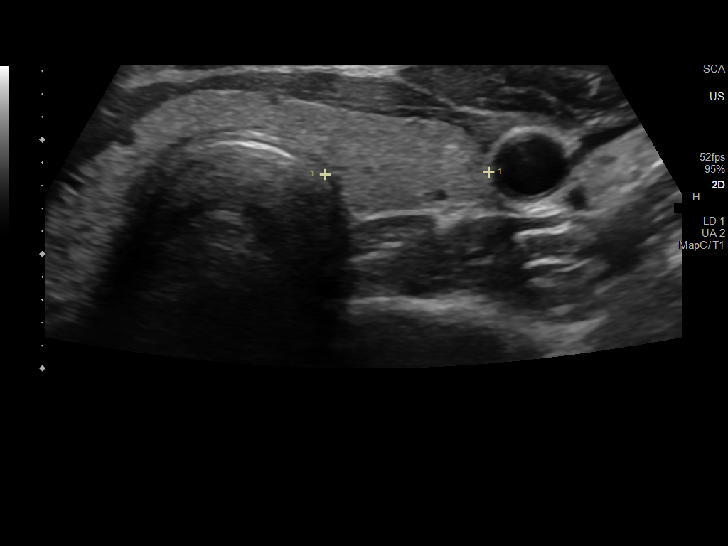
[im 16/19]
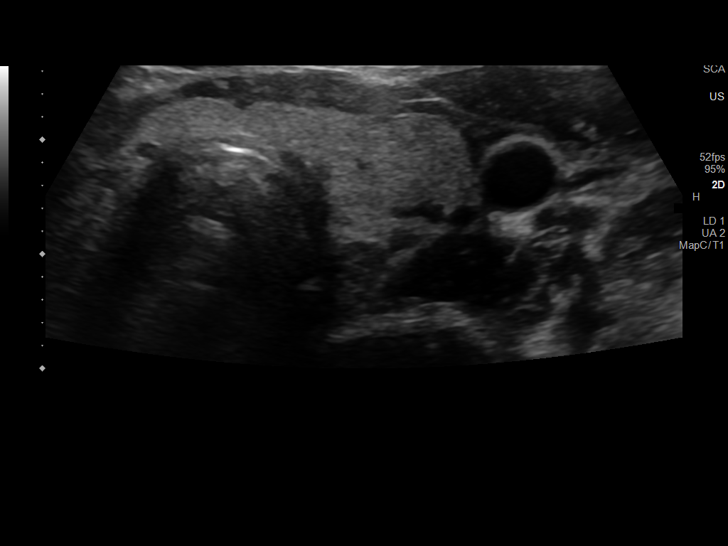
[im 17/19]
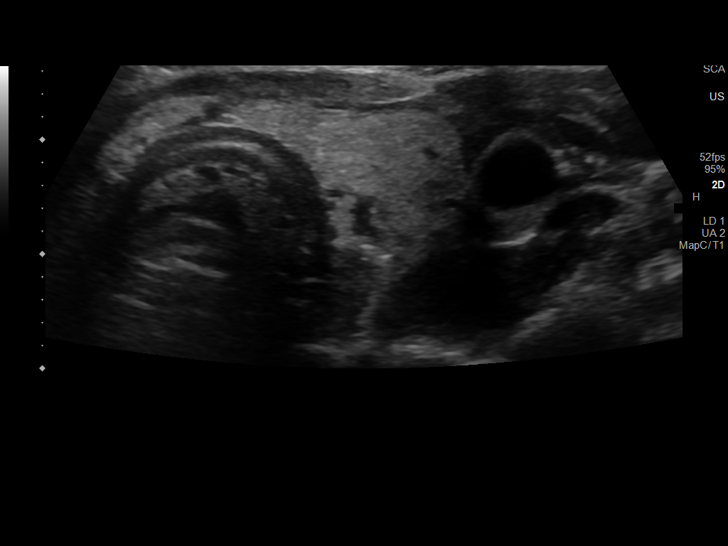
[im 19/19]
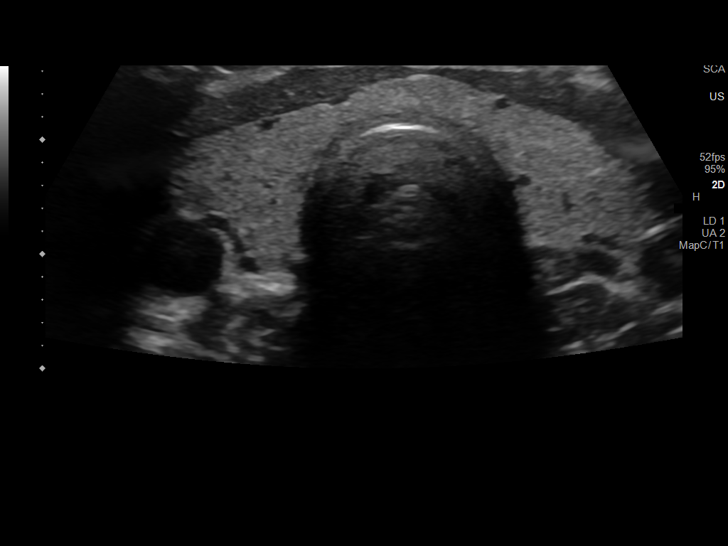

[14 of 19 positions shown; findings below may reference images not displayed]

FINDINGS: Parenchymal Echotexture: Mildly heterogenous

Isthmus: 3.8 mm

Right lobe: 5.1 x 1.3 x 1.2 cm

Left lobe: 5.2 x 1.5 x 1.4 cm

_________________________________________________________

Estimated total number of nodules >/= 1 cm: 0

Number of spongiform nodules >/=  2 cm not described below (TR1): 0

Number of mixed cystic and solid nodules >/= 1.5 cm not described
below (TR2): 0

_________________________________________________________

No discrete nodules are seen within the thyroid gland.
IMPRESSION: Normal thyroid ultrasound

The above is in keeping with the ACR TI-RADS recommendations - [HOSPITAL] 0908;[DATE].

## 2021-04-25 NOTE — Telephone Encounter (Signed)
Patient moved her colonoscopy up to 05/10/21 at 10:30 a.m. with Dr. Leonides Schanz.  She said she was supposed to be contacted by [facility] to help with her prep, but as of yet, she has not heard anything from them.  Can you please let patient know how to proceed?  Thank you.

## 2021-05-01 DIAGNOSIS — Z23 Encounter for immunization: Secondary | ICD-10-CM | POA: Diagnosis not present

## 2021-05-01 NOTE — Telephone Encounter (Signed)
Spoke with [facility] who was going to reach out to patient immediately

## 2021-05-04 ENCOUNTER — Encounter: Payer: Self-pay | Admitting: Internal Medicine

## 2021-05-04 ENCOUNTER — Other Ambulatory Visit: Payer: Self-pay

## 2021-05-04 ENCOUNTER — Other Ambulatory Visit (HOSPITAL_BASED_OUTPATIENT_CLINIC_OR_DEPARTMENT_OTHER): Payer: Self-pay | Admitting: Internal Medicine

## 2021-05-04 ENCOUNTER — Ambulatory Visit (INDEPENDENT_AMBULATORY_CARE_PROVIDER_SITE_OTHER): Payer: BC Managed Care – PPO

## 2021-05-04 DIAGNOSIS — Z1231 Encounter for screening mammogram for malignant neoplasm of breast: Secondary | ICD-10-CM

## 2021-05-05 ENCOUNTER — Other Ambulatory Visit: Payer: Self-pay | Admitting: Internal Medicine

## 2021-05-05 DIAGNOSIS — R928 Other abnormal and inconclusive findings on diagnostic imaging of breast: Secondary | ICD-10-CM

## 2021-05-10 ENCOUNTER — Ambulatory Visit (AMBULATORY_SURGERY_CENTER): Payer: BC Managed Care – PPO | Admitting: Internal Medicine

## 2021-05-10 ENCOUNTER — Other Ambulatory Visit: Payer: Self-pay

## 2021-05-10 ENCOUNTER — Encounter: Payer: Self-pay | Admitting: Internal Medicine

## 2021-05-10 VITALS — BP 96/50 | HR 65 | Temp 97.1°F | Resp 12 | Ht 69.0 in | Wt 153.0 lb

## 2021-05-10 DIAGNOSIS — Z1211 Encounter for screening for malignant neoplasm of colon: Secondary | ICD-10-CM | POA: Diagnosis not present

## 2021-05-10 DIAGNOSIS — K5900 Constipation, unspecified: Secondary | ICD-10-CM

## 2021-05-10 MED ORDER — SODIUM CHLORIDE 0.9 % IV SOLN: 500.0000 mL | Freq: Once | INTRAVENOUS | Status: DC

## 2021-05-10 NOTE — Progress Notes (Signed)
GASTROENTEROLOGY PROCEDURE H&P NOTE   Primary Care Physician: Wanda Plump, MD    Reason for Procedure:   Colon cancer screening  Plan:    Colonoscopy  Patient is appropriate for endoscopic procedure(s) in the ambulatory ([facility]) setting.  The nature of the procedure, as well as the risks, benefits, and alternatives were carefully and thoroughly reviewed with the patient. Ample time for discussion and questions allowed. The patient understood, was satisfied, and agreed to proceed.     HPI: Ruth Tucker is a 50 y.o. female who presents for colonoscopy for evaluation of colon cancer screening .  Patient was most recently seen in the Gastroenterology Clinic on 04/21/21.  No interval change in medical history since that appointment. Please refer to that note for full details regarding GI history and clinical presentation.   Past Medical History:  Diagnosis Date   COVID 05/30/2020   FH: colon polyps    Thyroid disease    hx enlarged thyroid--US normal   Varicella     Past Surgical History:  Procedure Laterality Date   adenitis mesenterica, 3 laparoscopies  2003   3 surgeries ([place])--   CESAREAN SECTION     2005 and 2008   PELVIC LAPAROSCOPY     uterine polyp removal ([place])      Prior to Admission medications   Medication Sig Start Date End Date Taking? Authorizing Provider  COLLAGEN PO Take by mouth.   Yes [provider]  Multiple Vitamin (MULTI-VITAMIN DAILY PO) Take by mouth.   Yes [provider]  Probiotic Product (PROBIOTIC-10 PO) Take by mouth.   Yes [provider]  psyllium (REGULOID) 0.52 g capsule Take 0.52 g by mouth daily.   Yes [provider]  chlorhexidine (PERIDEX) 0.12 % solution SMARTSIG:By Mouth Patient not taking: Reported on 05/10/2021 09/15/20   [provider]  Glucosamine-Chondroitin (OSTEO BI-FLEX REGULAR STRENGTH PO) Take by mouth in the morning and at bedtime.    [provider]     Current Outpatient Medications  Medication Sig Dispense Refill   COLLAGEN PO Take by mouth.     Multiple Vitamin (MULTI-VITAMIN DAILY PO) Take by mouth.     Probiotic Product (PROBIOTIC-10 PO) Take by mouth.     psyllium (REGULOID) 0.52 g capsule Take 0.52 g by mouth daily.     chlorhexidine (PERIDEX) 0.12 % solution SMARTSIG:By Mouth (Patient not taking: Reported on 05/10/2021)     Glucosamine-Chondroitin (OSTEO BI-FLEX REGULAR STRENGTH PO) Take by mouth in the morning and at bedtime.     Current Facility-Administered Medications  Medication Dose Route Frequency Provider Last Rate Last Admin   0.9 %  sodium chloride infusion  500 mL Intravenous Once Imogene Burn, MD        Allergies as of 05/10/2021   (No Known Allergies)    Family History  Problem Relation Age of Onset   Depression Mother    Hypertension Mother    Asthma Sister    Hypertension Brother    Colon cancer Maternal Aunt    Breast cancer Paternal Aunt    Colon cancer Paternal Aunt    Cancer Maternal Grandmother    Hypertension Maternal Grandmother    Cancer Maternal Grandfather    Hypertension Maternal Grandfather    Hypertension Paternal Grandmother    Cancer Paternal Grandfather    Hypertension Paternal Grandfather    Hearing loss Son    Colon cancer Other        aunt  Breast cancer Other        aunt   Cancer Other        Ovarian ca   Ovarian cancer Other    Esophageal cancer Neg Hx    Rectal cancer Neg Hx    Stomach cancer Neg Hx     Social History   Socioeconomic History   Marital status: Married    Spouse name: Not on file   Number of children: 2   Years of education: Not on file   Highest education level: Not on file  Occupational History   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  Tobacco Use   Smoking status: Never   Smokeless tobacco: Never  Substance and Sexual Activity   Alcohol use: Yes    Alcohol/week: 4.0 - 6.0 standard drinks    Types: 4 - 6 Glasses of wine per week    Comment:  moderate   Drug use: Never   Sexual activity: Yes    Birth control/protection: None  Other Topics Concern   Not on file  Social History Narrative   Original from [place], moved to [place] from [place] 2020   Social Determinants of Health   Financial Resource Strain: Not on file  Food Insecurity: Not on file  Transportation Needs: Not on file  Physical Activity: Not on file  Stress: Not on file  Social Connections: Not on file  Intimate Partner Violence: Not on file    Physical Exam: Vital signs in last 24 hours: BP 112/75    Pulse 70    Temp (!) 97.1 F (36.2 C) (Skin)    Ht 5\' 9"  (1.753 m)    Wt 153 lb (69.4 kg)    LMP 05/04/2021    SpO2 100%    BMI 22.59 kg/m  GEN: NAD EYE: Sclerae anicteric ENT: MMM CV: Non-tachycardic Pulm: No increased WOB GI: Soft NEURO:  Alert & Oriented   05/06/2021, MD [facility]   05/10/2021 11:05 AM

## 2021-05-10 NOTE — Patient Instructions (Signed)
Thank you for letting us take care of your healthcare needs today. ?Please see handouts given to you on Hemorrhoids. ? ? ? ?YOU HAD AN ENDOSCOPIC PROCEDURE TODAY AT THE [facility]:   Refer to the procedure report that was given to you for any specific questions about what was found during the examination.  If the procedure report does not answer your questions, please call your gastroenterologist to clarify.  If you requested that your care partner not be given the details of your procedure findings, then the procedure report has been included in a sealed envelope for you to review at your convenience later. ? ?YOU SHOULD EXPECT: Some feelings of bloating in the abdomen. Passage of more gas than usual.  Walking can help get rid of the air that was put into your GI tract during the procedure and reduce the bloating. If you had a lower endoscopy (such as a colonoscopy or flexible sigmoidoscopy) you may notice spotting of blood in your stool or on the toilet paper. If you underwent a bowel prep for your procedure, you may not have a normal bowel movement for a few days. ? ?Please Note:  You might notice some irritation and congestion in your nose or some drainage.  This is from the oxygen used during your procedure.  There is no need for concern and it should clear up in a day or so. ? ?SYMPTOMS TO REPORT IMMEDIATELY: ? ?Following lower endoscopy (colonoscopy or flexible sigmoidoscopy): ? Excessive amounts of blood in the stool ? Significant tenderness or worsening of abdominal pains ? Swelling of the abdomen that is new, acute ? Fever of 100?F or higher ? ? ?For urgent or emergent issues, a gastroenterologist can be reached at any hour by calling [phone]. ?Do not use MyChart messaging for urgent concerns.  ? ? ?DIET:  We do recommend a small meal at first, but then you may proceed to your regular diet.  Drink plenty of fluids but you should avoid alcoholic beverages for 24 hours. ? ?ACTIVITY:  You  should plan to take it easy for the rest of today and you should NOT DRIVE or use heavy machinery until tomorrow (because of the sedation medicines used during the test).   ? ?FOLLOW UP: ?Our staff will call the number listed on your records 48-72 hours following your procedure to check on you and address any questions or concerns that you may have regarding the information given to you following your procedure. If we do not reach you, we will leave a message.  We will attempt to reach you two times.  During this call, we will ask if you have developed any symptoms of COVID 19. If you develop any symptoms (ie: fever, flu-like symptoms, shortness of breath, cough etc.) before then, please call [phone].  If you test positive for Covid 19 in the 2 weeks post procedure, please call and report this information to us.   ? ?If any biopsies were taken you will be contacted by phone or by letter within the next 1-3 weeks.  Please call us at [phone] if you have not heard about the biopsies in 3 weeks.  ? ? ?SIGNATURES/CONFIDENTIALITY: ?You and/or your care partner have signed paperwork which will be entered into your electronic medical record.  These signatures attest to the fact that that the information above on your After Visit Summary has been reviewed and is understood.  Full responsibility of the confidentiality of this discharge information lies with   you and/or your care-partner.  ?

## 2021-05-10 NOTE — Progress Notes (Signed)
PT taken to PACU. Monitors in place. VSS. Report given to RN. 

## 2021-05-10 NOTE — Op Note (Signed)
[facility] Patient Name: Ruth Tucker Procedure Date: 05/10/2021 11:07 AM MRN: [id number] Endoscopist: Nicole Kindred "Eulah Pont ,  Age: 50 Referring MD:  Date of Birth: Aug 27, 1970 Gender: Female Account #: [id number] Procedure:                Colonoscopy Indications:              Screening for colorectal malignant neoplasm Medicines:                Monitored Anesthesia Care Procedure:                Pre-Anesthesia Assessment:                           - Prior to the procedure, a History and Physical                            was performed, and patient medications and                            allergies were reviewed. The patient's tolerance of                            previous anesthesia was also reviewed. The risks                            and benefits of the procedure and the sedation                            options and risks were discussed with the patient.                            All questions were answered, and informed consent                            was obtained. Prior Anticoagulants: The patient has                            taken no previous anticoagulant or antiplatelet                            agents. ASA Grade Assessment: I - A normal, healthy                            patient. After reviewing the risks and benefits,                            the patient was deemed in satisfactory condition to                            undergo the procedure.                           After obtaining informed consent, the colonoscope  was passed under direct vision. Throughout the                            procedure, the patient's blood pressure, pulse, and                            oxygen saturations were monitored continuously. The                            CF [id number] #[id number] was introduced through the anus                            and advanced to the the cecum, identified by                            appendiceal orifice  and ileocecal valve. The                            colonoscopy was performed without difficulty. The                            patient tolerated the procedure well. The quality                            of the bowel preparation was good. The ileocecal                            valve, appendiceal orifice, and rectum were                            photographed. Scope In: 11:11:41 AM Scope Out: 11:28:05 AM Scope Withdrawal Time: 0 hours 8 minutes 39 seconds  Total Procedure Duration: 0 hours 16 minutes 24 seconds  Findings:                 Non-bleeding internal hemorrhoids were found during                            retroflexion.                           The exam was otherwise without abnormality. Complications:            No immediate complications. Estimated Blood Loss:     Estimated blood loss: none. Impression:               - Non-bleeding internal hemorrhoids.                           - The examination was otherwise normal.                           - No specimens collected. Recommendation:           - Discharge patient to home (with escort).                           -  Repeat colonoscopy in 10 years for screening                            purposes.                           - The findings and recommendations were discussed                            with the patient. Nicole Kindred "Eulah Pont,  05/10/2021 11:32:23 AM

## 2021-05-12 ENCOUNTER — Telehealth: Payer: Self-pay | Admitting: *Deleted

## 2021-05-12 NOTE — Telephone Encounter (Signed)
°  Follow up Call-  Call back number 05/10/2021  Post procedure Call Back phone  # [phone]  Permission to leave phone message Yes     Patient questions:  Do you have a fever, pain , or abdominal swelling? No. Pain Score  0 *  Have you tolerated food without any problems? Yes.    Have you been able to return to your normal activities? Yes.    Do you have any questions about your discharge instructions: Diet   No. Medications  No. Follow up visit  No.  Do you have questions or concerns about your Care? No.  Actions: * If pain score is 4 or above: No action needed, pain <4.  Have you developed a fever since your procedure? no  2.   Have you had an respiratory symptoms (SOB or cough) since your procedure? no  3.   Have you tested positive for COVID 19 since your procedure no  4.   Have you had any family members/close contacts diagnosed with the COVID 19 since your procedure?  no   If yes to any of these questions please route to Laverna Peace, RN and Karlton Lemon, RN

## 2021-05-29 ENCOUNTER — Other Ambulatory Visit: Payer: BC Managed Care – PPO

## 2021-05-29 DIAGNOSIS — Z20822 Contact with and (suspected) exposure to covid-19: Secondary | ICD-10-CM | POA: Diagnosis not present

## 2021-05-30 ENCOUNTER — Encounter: Payer: BC Managed Care – PPO | Admitting: Internal Medicine

## 2021-06-09 DIAGNOSIS — M5386 Other specified dorsopathies, lumbar region: Secondary | ICD-10-CM | POA: Diagnosis not present

## 2021-06-09 DIAGNOSIS — M9905 Segmental and somatic dysfunction of pelvic region: Secondary | ICD-10-CM | POA: Diagnosis not present

## 2021-06-09 DIAGNOSIS — M9902 Segmental and somatic dysfunction of thoracic region: Secondary | ICD-10-CM | POA: Diagnosis not present

## 2021-06-09 DIAGNOSIS — M9903 Segmental and somatic dysfunction of lumbar region: Secondary | ICD-10-CM | POA: Diagnosis not present

## 2021-06-14 ENCOUNTER — Other Ambulatory Visit: Payer: BC Managed Care – PPO

## 2021-06-20 ENCOUNTER — Ambulatory Visit
Admission: RE | Admit: 2021-06-20 | Discharge: 2021-06-20 | Disposition: A | Discharge status: Home / Self Care | Payer: BC Managed Care – PPO | Source: Ambulatory Visit | Attending: Internal Medicine | Admitting: Internal Medicine

## 2021-06-20 DIAGNOSIS — R928 Other abnormal and inconclusive findings on diagnostic imaging of breast: Secondary | ICD-10-CM

## 2021-06-20 DIAGNOSIS — R922 Inconclusive mammogram: Secondary | ICD-10-CM | POA: Diagnosis not present

## 2021-06-29 ENCOUNTER — Other Ambulatory Visit: Payer: BC Managed Care – PPO

## 2021-09-27 DIAGNOSIS — M9907 Segmental and somatic dysfunction of upper extremity: Secondary | ICD-10-CM | POA: Diagnosis not present

## 2021-09-27 DIAGNOSIS — M5414 Radiculopathy, thoracic region: Secondary | ICD-10-CM | POA: Diagnosis not present

## 2021-09-27 DIAGNOSIS — M9902 Segmental and somatic dysfunction of thoracic region: Secondary | ICD-10-CM | POA: Diagnosis not present

## 2021-09-27 DIAGNOSIS — M25512 Pain in left shoulder: Secondary | ICD-10-CM | POA: Diagnosis not present

## 2021-09-27 DIAGNOSIS — M9901 Segmental and somatic dysfunction of cervical region: Secondary | ICD-10-CM | POA: Diagnosis not present

## 2021-09-27 DIAGNOSIS — M9903 Segmental and somatic dysfunction of lumbar region: Secondary | ICD-10-CM | POA: Diagnosis not present

## 2021-10-02 DIAGNOSIS — M9903 Segmental and somatic dysfunction of lumbar region: Secondary | ICD-10-CM | POA: Diagnosis not present

## 2021-10-02 DIAGNOSIS — M9902 Segmental and somatic dysfunction of thoracic region: Secondary | ICD-10-CM | POA: Diagnosis not present

## 2021-10-02 DIAGNOSIS — M5414 Radiculopathy, thoracic region: Secondary | ICD-10-CM | POA: Diagnosis not present

## 2021-10-02 DIAGNOSIS — M9901 Segmental and somatic dysfunction of cervical region: Secondary | ICD-10-CM | POA: Diagnosis not present

## 2021-10-03 ENCOUNTER — Ambulatory Visit (INDEPENDENT_AMBULATORY_CARE_PROVIDER_SITE_OTHER): Payer: BC Managed Care – PPO | Admitting: Obstetrics and Gynecology

## 2021-10-03 ENCOUNTER — Other Ambulatory Visit (HOSPITAL_COMMUNITY)
Admission: RE | Admit: 2021-10-03 | Discharge: 2021-10-03 | Disposition: A | Discharge status: Home / Self Care | Payer: BC Managed Care – PPO | Source: Ambulatory Visit | Attending: Obstetrics and Gynecology | Admitting: Obstetrics and Gynecology

## 2021-10-03 ENCOUNTER — Encounter: Payer: Self-pay | Admitting: Obstetrics and Gynecology

## 2021-10-03 VITALS — BP 118/66 | HR 72 | Resp 12 | Ht 69.0 in | Wt 154.5 lb

## 2021-10-03 DIAGNOSIS — R5383 Other fatigue: Secondary | ICD-10-CM | POA: Diagnosis not present

## 2021-10-03 DIAGNOSIS — E7841 Elevated Lipoprotein(a): Secondary | ICD-10-CM | POA: Diagnosis not present

## 2021-10-03 DIAGNOSIS — N951 Menopausal and female climacteric states: Secondary | ICD-10-CM | POA: Diagnosis not present

## 2021-10-03 DIAGNOSIS — Z124 Encounter for screening for malignant neoplasm of cervix: Secondary | ICD-10-CM

## 2021-10-03 DIAGNOSIS — Z01419 Encounter for gynecological examination (general) (routine) without abnormal findings: Secondary | ICD-10-CM

## 2021-10-03 NOTE — Progress Notes (Signed)
51 y.o. G29P0012 Married [place] female here for annual exam.   ? ?Some increased heat.  ?Menses are irregular, but does occur monthly.  ?Sometime heavy and sometime light.  ?Difficulty to maintain her weight and not gain.  ? ?Declines treatment of her menses.  ?Declines birth control.  ? ?Chronic fatigue.  ?Not sleeping well for awhile.  ? ?PCP:   Kathlene November, MD  ? ?Patient's last menstrual period was 09/17/2021.     ?Period Cycle (Days): 28 ?Period Duration (Days): 4 ?Period Pattern: Regular ?Menstrual Flow: Moderate, Heavy ?Menstrual Control: Maxi pad ?Menstrual Control Change Freq (Hours): changes pad 3 times a day ?Dysmenorrhea: (!) Moderate ?Dysmenorrhea Symptoms: Cramping, Other (Comment) (lower back pain) ?    ?Sexually active: Yes.    ?The current method of family planning is none.    ?Exercising: Yes.     Dancing, pilates ?Smoker:  no ? ?Health Maintenance: ?Pap:  01/2019 normal, HR HPV negative in [place]. ?History of abnormal Pap:  no ?MMG:  06-20-21 BIRADS 2 benign  ?Colonoscopy: 05-10-21 normal  ?BMD:   n/a Result  n/a ?TDaP:  04-19-20 ?Gardasil:   no ?HIV: 04-20-21 negative  ?Hep C: 04-20-21 negative  ?Screening Labs:  Hb today: PCP, Urine today: not collected ? ? reports that she has never smoked. She has never used smokeless tobacco. She reports current alcohol use of about 4.0 - 6.0 standard drinks per week. She reports that she does not use drugs. ? ?Past Medical History:  ?Diagnosis Date  ? COVID 05/30/2020  ? FH: colon polyps   ? Thyroid disease   ? hx enlarged thyroid--US normal  ? Varicella   ? ? ?Past Surgical History:  ?Procedure Laterality Date  ? adenitis mesenterica, 3 laparoscopies  2003  ? 3 surgeries ([place])--  ? CESAREAN SECTION    ? 2005 and 2008  ? PELVIC LAPAROSCOPY    ? uterine polyp removal ([place])    ? ? ?Current Outpatient Medications  ?Medication Sig Dispense Refill  ? COLLAGEN PO Take by mouth.    ? Glucosamine-Chondroitin (OSTEO BI-FLEX REGULAR STRENGTH PO) Take by mouth in  the morning and at bedtime.    ? ?No current facility-administered medications for this visit.  ? ? ?Family History  ?Problem Relation Age of Onset  ? Depression Mother   ? Hypertension Mother   ? Asthma Sister   ? Hypertension Brother   ? Colon cancer Maternal Aunt   ? Breast cancer Paternal Aunt   ? Colon cancer Paternal Aunt   ? Cancer Maternal Grandmother   ? Hypertension Maternal Grandmother   ? Cancer Maternal Grandfather   ? Hypertension Maternal Grandfather   ? Hypertension Paternal Grandmother   ? Cancer Paternal Grandfather   ? Hypertension Paternal Grandfather   ? Hearing loss Son   ? Colon cancer Other   ?     aunt   ? Breast cancer Other   ?     aunt  ? Cancer Other   ?     Ovarian ca  ? Ovarian cancer Other   ? Esophageal cancer Neg Hx   ? Rectal cancer Neg Hx   ? Stomach cancer Neg Hx   ? ? ?Review of Systems  ?Constitutional:  Positive for fatigue.  ?Genitourinary:   ?     Irregular periods  ?Skin:   ?     Hair loss  ?All other systems reviewed and are negative. ? ?Exam:   ?BP 118/66 (BP Location: Right Arm, Patient  Position: Sitting, Cuff Size: Normal)   Pulse 72   Resp 12   Ht 5\' 9"  (1.753 m)   Wt 154 lb 8 oz (70.1 kg)   LMP 09/17/2021   BMI 22.82 kg/m?     ?General appearance: alert, cooperative and appears stated age ?Head: normocephalic, without obvious abnormality, atraumatic ?Neck: no adenopathy, supple, symmetrical, trachea midline and thyroid normal to inspection and palpation ?Lungs: clear to auscultation bilaterally ?Breasts: normal appearance, no masses or tenderness, No nipple retraction or dimpling, No nipple discharge or bleeding, No axillary adenopathy ?Heart: regular rate and rhythm ?Abdomen: soft, non-tender; no masses, no organomegaly ?Extremities: extremities normal, atraumatic, no cyanosis or edema ?Skin: skin color, texture, turgor normal. No rashes or lesions ?Lymph nodes: cervical, supraclavicular, and axillary nodes normal. ?Neurologic: grossly normal ? ?Pelvic:  External genitalia:  no lesions ?             No abnormal inguinal nodes palpated. ?             Urethra:  normal appearing urethra with no masses, tenderness or lesions ?             Bartholins and Skenes: normal    ?             Vagina: normal appearing vagina with normal color and discharge, no lesions ?             Cervix: no lesions ?             Pap taken: yes ?Bimanual Exam:  Uterus:  normal size, contour, position, consistency, mobility, non-tender ?             Adnexa: no mass, fullness, tenderness ?             Rectal exam: yes.  Confirms. ?             Anus:  normal sphincter tone, no lesions ? ?Chaperone was present for exam:  Raquel Sarna, RN ? ?Assessment:   ?Well woman visit with gynecologic exam. ?Perimenopause.  ?Menopausal symptoms. ?Fatigue. ? ?Plan: ?Mammogram screening discussed. ?Self breast awareness reviewed. ?Pap and HR HPV collected. ?Will check TSH, FSH, and estradiol.  ?Brochure on phases of menopause.  ?We dicussed herbal remedies for menopausal symptoms.  ?Guidelines for Calcium, Vitamin D, regular exercise program including cardiovascular and weight bearing exercise. ? Follow up annually and prn.  ? ?After visit summary provided.  ? ? ? ?

## 2021-10-03 NOTE — Patient Instructions (Addendum)
EXERCISE AND DIET:  We recommended that you start or continue a regular exercise program for good health. Regular exercise means any activity that makes your heart beat faster and makes you sweat.  We recommend exercising at least 30 minutes per day at least 3 days a week, preferably 4 or 5.  We also recommend a diet low in fat and sugar.  Inactivity, poor dietary choices and obesity can cause diabetes, heart attack, stroke, and kidney damage, among others.   ? ?ALCOHOL AND SMOKING:  Women should limit their alcohol intake to no more than 7 drinks/beers/glasses of wine (combined, not each!) per week. Moderation of alcohol intake to this level decreases your risk of breast cancer and liver damage. And of course, no recreational drugs are part of a healthy lifestyle.  And absolutely no smoking or even second hand smoke. Most people know smoking can cause heart and lung diseases, but did you know it also contributes to weakening of your bones? Aging of your skin?  Yellowing of your teeth and nails? ? ?CALCIUM AND VITAMIN D:  Adequate intake of calcium and Vitamin D are recommended.  The recommendations for exact amounts of these supplements seem to change often, but generally speaking 600 mg of calcium (either carbonate or citrate) and 800 units of Vitamin D per day seems prudent. Certain women may benefit from higher intake of Vitamin D.  If you are among these women, your doctor will have told you during your visit.   ? ?PAP SMEARS:  Pap smears, to check for cervical cancer or precancers,  have traditionally been done yearly, although recent scientific advances have shown that most women can have pap smears less often.  However, every woman still should have a physical exam from her gynecologist every year. It will include a breast check, inspection of the vulva and vagina to check for abnormal growths or skin changes, a visual exam of the cervix, and then an exam to evaluate the size and shape of the uterus and  ovaries.  And after 49 years of age, a rectal exam is indicated to check for rectal cancers. We will also provide age appropriate advice regarding health maintenance, like when you should have certain vaccines, screening for sexually transmitted diseases, bone density testing, colonoscopy, mammograms, etc.  ? ?MAMMOGRAMS:  All women over 45 years old should have a yearly mammogram. Many facilities now offer a "3D" mammogram, which may cost around $50 extra out of pocket. If possible,  we recommend you accept the option to have the 3D mammogram performed.  It both reduces the number of women who will be called back for extra views which then turn out to be normal, and it is better than the routine mammogram at detecting truly abnormal areas.   ? ?COLONOSCOPY:  Colonoscopy to screen for colon cancer is recommended for all women at age 18.  We know, you hate the idea of the prep.  We agree, BUT, having colon cancer and not knowing it is worse!!  Colon cancer so often starts as a polyp that can be seen and removed at colonscopy, which can quite literally save your life!  And if your first colonoscopy is normal and you have no family history of colon cancer, most women don't have to have it again for 10 years.  Once every ten years, you can do something that may end up saving your life, right?  We will be happy to help you get it scheduled when you are ready.  Be sure to check your insurance coverage so you understand how much it will cost.  It may be covered as a preventative service at no cost, but you should check your particular policy.   ? ?Consider Estroven or Amberen as natural products to treat menopausal symptoms.  ? ?Calcium Content in Foods ?Calcium is the most abundant mineral in the body. Most of the body's calcium supply is stored in bones and teeth. Calcium helps many parts of the body function normally, including: ?Blood and blood vessels. ?Nerves. ?Hormones. ?Muscles. ?Bones and teeth. ?When your calcium  stores are low, you may be at risk for low bone mass, bone loss, and broken bones (fractures). When you get enough calcium, it helps to support strong bones and teeth throughout your life. ?Calcium is especially important for: ?Children during growth spurts. ?Girls during adolescence. ?Women who are pregnant or breastfeeding. ?Women after their menstrual cycle stops (postmenopause). ?Women whose menstrual cycle has stopped due to anorexia nervosa or regular intense exercise. ?People who cannot eat or digest dairy products. ?Vegans. ?Recommended daily amounts of calcium: ?Women (ages 69 to 66): 1,000 mg per day. ?Women (ages 9 and older): 1,200 mg per day. ?Men (ages 58 to 49): 1,000 mg per day. ?Men (ages 30 and older): 1,200 mg per day. ?Women (ages 40 to 42): 1,300 mg per day. ?Men (ages 74 to 39): 1,300 mg per day. ?General information ?Eat foods that are high in calcium. Try to get most of your calcium from food. ?Some people may benefit from taking calcium supplements. Check with your health care provider or diet and nutrition specialist (dietitian) before starting any calcium supplements. Calcium supplements may interact with certain medicines. Too much calcium may cause other health problems, such as constipation and kidney stones. ?For the body to absorb calcium, it needs vitamin D. Sources of vitamin D include: ?Skin exposure to direct sunlight. ?Foods, such as egg yolks, liver, mushrooms, saltwater fish, and fortified milk. ?Vitamin D supplements. Check with your health care provider or dietitian before starting any vitamin D supplements. ?What foods are high in calcium? ? ?Foods that are high in calcium contain more than 100 milligrams per serving. ?Fruits ?Fortified orange juice or other fruit juice, 300 mg per 8 oz serving. ?Vegetables ?Collard greens, 360 mg per 8 oz serving. ?Kale, 100 mg per 8 oz serving. ?Bok choy, 160 mg per 8 oz serving. ?Grains ?Fortified ready-to-eat cereals, 100 to 1,000 mg per  8 oz serving. ?Fortified frozen waffles, 200 mg in 2 waffles. ?Oatmeal, 140 mg in 1 cup. ?Meats and other proteins ?Sardines, canned with bones, 325 mg per 3 oz serving. ?Salmon, canned with bones, 180 mg per 3 oz serving. ?Canned shrimp, 125 mg per 3 oz serving. ?Baked beans, 160 mg per 4 oz serving. ?Tofu, firm, made with calcium sulfate, 253 mg per 4 oz serving. ?Dairy ?Yogurt, plain, low-fat, 310 mg per 6 oz serving. ?Nonfat milk, 300 mg per 8 oz serving. ?American cheese, 195 mg per 1 oz serving. ?Cheddar cheese, 205 mg per 1 oz serving. ?Cottage cheese 2%, 105 mg per 4 oz serving. ?Fortified soy, rice, or almond milk, 300 mg per 8 oz serving. ?Mozzarella, part skim, 210 mg per 1 oz serving. ?The items listed above may not be a complete list of foods high in calcium. Actual amounts of calcium may be different depending on processing. Contact a dietitian for more information. ?What foods are lower in calcium? ?Foods that are lower in calcium contain 50 mg  or less per serving. ?Fruits ?Apple, about 6 mg. ?Banana, about 12 mg. ?Vegetables ?Lettuce, 19 mg per 2 oz serving. ?Tomato, about 11 mg. ?Grains ?Rice, 4 mg per 6 oz serving. ?Boiled potatoes, 14 mg per 8 oz serving. ?White bread, 6 mg per slice. ?Meats and other proteins ?Egg, 27 mg per 2 oz serving. ?Red meat, 7 mg per 4 oz serving. ?Chicken, 17 mg per 4 oz serving. ?Fish, cod, or trout, 20 mg per 4 oz serving. ?Dairy ?Cream cheese, regular, 14 mg per 1 Tbsp serving. ?Brie cheese, 50 mg per 1 oz serving. ?Parmesan cheese, 70 mg per 1 Tbsp serving. ?The items listed above may not be a complete list of foods lower in calcium. Actual amounts of calcium may be different depending on processing. Contact a dietitian for more information. ?Summary ?Calcium is an important mineral in the body because it affects many functions. Getting enough calcium helps support strong bones and teeth throughout your life. ?Try to get most of your calcium from food. ?Calcium  supplements may interact with certain medicines. Check with your health care provider or dietitian before starting any calcium supplements. ?This information is not intended to replace advice given to you by

## 2021-10-04 DIAGNOSIS — M9901 Segmental and somatic dysfunction of cervical region: Secondary | ICD-10-CM | POA: Diagnosis not present

## 2021-10-04 DIAGNOSIS — M9903 Segmental and somatic dysfunction of lumbar region: Secondary | ICD-10-CM | POA: Diagnosis not present

## 2021-10-04 DIAGNOSIS — M5414 Radiculopathy, thoracic region: Secondary | ICD-10-CM | POA: Diagnosis not present

## 2021-10-04 DIAGNOSIS — M9902 Segmental and somatic dysfunction of thoracic region: Secondary | ICD-10-CM | POA: Diagnosis not present

## 2021-10-04 LAB — CYTOLOGY - PAP
Adequacy: ABSENT
Comment: NEGATIVE
Diagnosis: NEGATIVE
High risk HPV: NEGATIVE

## 2021-10-04 LAB — TSH: TSH: 1.27 mIU/L

## 2021-10-04 LAB — ESTRADIOL: Estradiol: 191 pg/mL

## 2021-10-04 LAB — FOLLICLE STIMULATING HORMONE: FSH: 4.6 m[IU]/mL

## 2021-10-11 ENCOUNTER — Ambulatory Visit: Payer: BC Managed Care – PPO | Admitting: Obstetrics and Gynecology

## 2021-10-17 DIAGNOSIS — M9901 Segmental and somatic dysfunction of cervical region: Secondary | ICD-10-CM | POA: Diagnosis not present

## 2021-10-17 DIAGNOSIS — M9903 Segmental and somatic dysfunction of lumbar region: Secondary | ICD-10-CM | POA: Diagnosis not present

## 2021-10-17 DIAGNOSIS — M9902 Segmental and somatic dysfunction of thoracic region: Secondary | ICD-10-CM | POA: Diagnosis not present

## 2021-10-17 DIAGNOSIS — M5414 Radiculopathy, thoracic region: Secondary | ICD-10-CM | POA: Diagnosis not present

## 2021-10-27 DIAGNOSIS — M9901 Segmental and somatic dysfunction of cervical region: Secondary | ICD-10-CM | POA: Diagnosis not present

## 2021-10-27 DIAGNOSIS — M5414 Radiculopathy, thoracic region: Secondary | ICD-10-CM | POA: Diagnosis not present

## 2021-10-27 DIAGNOSIS — M9903 Segmental and somatic dysfunction of lumbar region: Secondary | ICD-10-CM | POA: Diagnosis not present

## 2021-10-27 DIAGNOSIS — M9902 Segmental and somatic dysfunction of thoracic region: Secondary | ICD-10-CM | POA: Diagnosis not present

## 2021-11-29 DIAGNOSIS — M9902 Segmental and somatic dysfunction of thoracic region: Secondary | ICD-10-CM | POA: Diagnosis not present

## 2021-11-29 DIAGNOSIS — M9903 Segmental and somatic dysfunction of lumbar region: Secondary | ICD-10-CM | POA: Diagnosis not present

## 2021-11-29 DIAGNOSIS — M5414 Radiculopathy, thoracic region: Secondary | ICD-10-CM | POA: Diagnosis not present

## 2021-11-29 DIAGNOSIS — M9901 Segmental and somatic dysfunction of cervical region: Secondary | ICD-10-CM | POA: Diagnosis not present

## 2022-01-08 DIAGNOSIS — M25572 Pain in left ankle and joints of left foot: Secondary | ICD-10-CM | POA: Diagnosis not present

## 2022-01-08 DIAGNOSIS — M6281 Muscle weakness (generalized): Secondary | ICD-10-CM | POA: Diagnosis not present

## 2022-01-08 DIAGNOSIS — M542 Cervicalgia: Secondary | ICD-10-CM | POA: Diagnosis not present

## 2022-01-08 DIAGNOSIS — M5451 Vertebrogenic low back pain: Secondary | ICD-10-CM | POA: Diagnosis not present

## 2022-01-10 DIAGNOSIS — M6281 Muscle weakness (generalized): Secondary | ICD-10-CM | POA: Diagnosis not present

## 2022-01-10 DIAGNOSIS — M542 Cervicalgia: Secondary | ICD-10-CM | POA: Diagnosis not present

## 2022-01-10 DIAGNOSIS — M25572 Pain in left ankle and joints of left foot: Secondary | ICD-10-CM | POA: Diagnosis not present

## 2022-01-10 DIAGNOSIS — M5451 Vertebrogenic low back pain: Secondary | ICD-10-CM | POA: Diagnosis not present

## 2022-01-18 DIAGNOSIS — M6281 Muscle weakness (generalized): Secondary | ICD-10-CM | POA: Diagnosis not present

## 2022-01-18 DIAGNOSIS — M5451 Vertebrogenic low back pain: Secondary | ICD-10-CM | POA: Diagnosis not present

## 2022-01-18 DIAGNOSIS — M25572 Pain in left ankle and joints of left foot: Secondary | ICD-10-CM | POA: Diagnosis not present

## 2022-01-18 DIAGNOSIS — M542 Cervicalgia: Secondary | ICD-10-CM | POA: Diagnosis not present

## 2022-01-31 DIAGNOSIS — M25572 Pain in left ankle and joints of left foot: Secondary | ICD-10-CM | POA: Diagnosis not present

## 2022-01-31 DIAGNOSIS — M5451 Vertebrogenic low back pain: Secondary | ICD-10-CM | POA: Diagnosis not present

## 2022-01-31 DIAGNOSIS — M6281 Muscle weakness (generalized): Secondary | ICD-10-CM | POA: Diagnosis not present

## 2022-01-31 DIAGNOSIS — M542 Cervicalgia: Secondary | ICD-10-CM | POA: Diagnosis not present

## 2022-02-15 DIAGNOSIS — M6281 Muscle weakness (generalized): Secondary | ICD-10-CM | POA: Diagnosis not present

## 2022-02-15 DIAGNOSIS — M5451 Vertebrogenic low back pain: Secondary | ICD-10-CM | POA: Diagnosis not present

## 2022-02-15 DIAGNOSIS — M542 Cervicalgia: Secondary | ICD-10-CM | POA: Diagnosis not present

## 2022-02-15 DIAGNOSIS — M25572 Pain in left ankle and joints of left foot: Secondary | ICD-10-CM | POA: Diagnosis not present

## 2022-02-20 DIAGNOSIS — M25572 Pain in left ankle and joints of left foot: Secondary | ICD-10-CM | POA: Diagnosis not present

## 2022-02-20 DIAGNOSIS — M6281 Muscle weakness (generalized): Secondary | ICD-10-CM | POA: Diagnosis not present

## 2022-02-20 DIAGNOSIS — M5451 Vertebrogenic low back pain: Secondary | ICD-10-CM | POA: Diagnosis not present

## 2022-02-20 DIAGNOSIS — M542 Cervicalgia: Secondary | ICD-10-CM | POA: Diagnosis not present

## 2022-02-22 DIAGNOSIS — M542 Cervicalgia: Secondary | ICD-10-CM | POA: Diagnosis not present

## 2022-02-22 DIAGNOSIS — M5451 Vertebrogenic low back pain: Secondary | ICD-10-CM | POA: Diagnosis not present

## 2022-02-22 DIAGNOSIS — M25572 Pain in left ankle and joints of left foot: Secondary | ICD-10-CM | POA: Diagnosis not present

## 2022-02-22 DIAGNOSIS — M6281 Muscle weakness (generalized): Secondary | ICD-10-CM | POA: Diagnosis not present

## 2022-03-20 DIAGNOSIS — M542 Cervicalgia: Secondary | ICD-10-CM | POA: Diagnosis not present

## 2022-03-20 DIAGNOSIS — M5451 Vertebrogenic low back pain: Secondary | ICD-10-CM | POA: Diagnosis not present

## 2022-03-20 DIAGNOSIS — M25572 Pain in left ankle and joints of left foot: Secondary | ICD-10-CM | POA: Diagnosis not present

## 2022-03-20 DIAGNOSIS — M6281 Muscle weakness (generalized): Secondary | ICD-10-CM | POA: Diagnosis not present

## 2022-04-18 DIAGNOSIS — M25572 Pain in left ankle and joints of left foot: Secondary | ICD-10-CM | POA: Diagnosis not present

## 2022-04-23 ENCOUNTER — Encounter: Payer: BC Managed Care – PPO | Admitting: Internal Medicine

## 2022-05-02 ENCOUNTER — Encounter: Payer: BC Managed Care – PPO | Admitting: Internal Medicine

## 2022-05-29 ENCOUNTER — Ambulatory Visit (INDEPENDENT_AMBULATORY_CARE_PROVIDER_SITE_OTHER): Payer: BC Managed Care – PPO | Admitting: Internal Medicine

## 2022-05-29 ENCOUNTER — Encounter: Payer: Self-pay | Admitting: Internal Medicine

## 2022-05-29 VITALS — BP 116/66 | HR 63 | Temp 97.8°F | Resp 16 | Ht 69.0 in | Wt 156.0 lb

## 2022-05-29 DIAGNOSIS — G4700 Insomnia, unspecified: Secondary | ICD-10-CM

## 2022-05-29 DIAGNOSIS — G8929 Other chronic pain: Secondary | ICD-10-CM | POA: Diagnosis not present

## 2022-05-29 DIAGNOSIS — K5909 Other constipation: Secondary | ICD-10-CM | POA: Diagnosis not present

## 2022-05-29 DIAGNOSIS — Z1231 Encounter for screening mammogram for malignant neoplasm of breast: Secondary | ICD-10-CM

## 2022-05-29 DIAGNOSIS — Z0189 Encounter for other specified special examinations: Secondary | ICD-10-CM

## 2022-05-29 DIAGNOSIS — Z23 Encounter for immunization: Secondary | ICD-10-CM

## 2022-05-29 DIAGNOSIS — Z0001 Encounter for general adult medical examination with abnormal findings: Secondary | ICD-10-CM

## 2022-05-29 DIAGNOSIS — Z0000 Encounter for general adult medical examination without abnormal findings: Secondary | ICD-10-CM

## 2022-05-29 DIAGNOSIS — M25562 Pain in left knee: Secondary | ICD-10-CM | POA: Diagnosis not present

## 2022-05-29 LAB — COMPREHENSIVE METABOLIC PANEL
ALT: 11 U/L (ref 0–35)
AST: 15 U/L (ref 0–37)
Albumin: 4.6 g/dL (ref 3.5–5.2)
Alkaline Phosphatase: 44 U/L (ref 39–117)
BUN: 12 mg/dL (ref 6–23)
CO2: 28 mEq/L (ref 19–32)
Calcium: 9.3 mg/dL (ref 8.4–10.5)
Chloride: 103 mEq/L (ref 96–112)
Creatinine, Ser: 0.81 mg/dL (ref 0.40–1.20)
GFR: 85.08 mL/min (ref >60.00)
Glucose, Bld: 82 mg/dL (ref 70–99)
Potassium: 4.1 mEq/L (ref 3.5–5.1)
Sodium: 138 mEq/L (ref 135–145)
Total Bilirubin: 0.8 mg/dL (ref 0.2–1.2)
Total Protein: 7 g/dL (ref 6.0–8.3)

## 2022-05-29 LAB — LIPID PANEL
Cholesterol: 195 mg/dL (ref 0–200)
HDL: 74.8 mg/dL (ref >39.00)
LDL Cholesterol: 107 mg/dL — ABNORMAL HIGH (ref 0–99)
NonHDL: 119.93
Total CHOL/HDL Ratio: 3
Triglycerides: 65 mg/dL (ref 0.0–149.0)
VLDL: 13 mg/dL (ref 0.0–40.0)

## 2022-05-29 LAB — CBC WITH DIFFERENTIAL/PLATELET
Basophils Absolute: 0.1 10*3/uL (ref 0.0–0.1)
Basophils Relative: 1.1 % (ref 0.0–3.0)
Eosinophils Absolute: 0.3 10*3/uL (ref 0.0–0.7)
Eosinophils Relative: 5 % (ref 0.0–5.0)
HCT: 39.9 % (ref 36.0–46.0)
Hemoglobin: 13.3 g/dL (ref 12.0–15.0)
Lymphocytes Relative: 33.5 % (ref 12.0–46.0)
Lymphs Abs: 2 10*3/uL (ref 0.7–4.0)
MCHC: 33.4 g/dL (ref 30.0–36.0)
MCV: 93.6 fl (ref 78.0–100.0)
Monocytes Absolute: 0.4 10*3/uL (ref 0.1–1.0)
Monocytes Relative: 7.5 % (ref 3.0–12.0)
Neutro Abs: 3.1 10*3/uL (ref 1.4–7.7)
Neutrophils Relative %: 52.9 % (ref 43.0–77.0)
Platelets: 268 10*3/uL (ref 150.0–400.0)
RBC: 4.27 Mil/uL (ref 3.87–5.11)
RDW: 12.8 % (ref 11.5–15.5)
WBC: 5.9 10*3/uL (ref 4.0–10.5)

## 2022-05-29 NOTE — Assessment & Plan Note (Signed)
Here for CPX. We also discussed the following Constipation Saw GI 04/21/2021 for chronic constipation, recommended increase fiber intake, MiraLAX and a colonoscopy, consider Linzess.  Patient is taking fiber but is reluctant to take MiraLAX daily.  Does not like to take much medicine.  I recommend to continue fiber, take MiraLAX as needed.  Rec to reach out to GI if she decides to pursue Linzess. Knee ankle pain: She is extremely active, exam is benign, recommend to continue exercising, OTCs as needed, call if symptoms increase. Insomnia: Described as broken sleep, wakes up unrested.  Mild snoring, Epworth scale 14.  Although her phenotype does not put her at risk for sleep apnea, she does have symptoms and the screen is positive. Plan:  Refer to neuro, r/o OSA RTC 6 months

## 2022-05-29 NOTE — Addendum Note (Signed)
Addended by: Kathlene November E on: 05/29/2022 01:01 PM   Modules accepted: Level of Service

## 2022-05-29 NOTE — Assessment & Plan Note (Signed)
Tdap:2021 COVID vax booster rec  Flu shot today. Female care per Dr. Quincy Simmonds, Rx MMG CCS: C-scope 2016 while she was living in [place], it was done after infection (colitis?),  + FH (aunt with colon cancer).C-scope 04-2021, next per GI.   Labs: CMP, CBC, FLP Lifestyle is very health

## 2022-05-29 NOTE — Patient Instructions (Addendum)
Vaccines I recommend:  Covid booster  Continue fiber supplements, use MiraLAX 17 g daily as needed If you continue constipation, reach out to gastroenterology.  Linzess?.  Will refer you to neurology for difficulty sleeping and possibly sleep apnea  GO TO THE LAB : Get the blood work     [place], [place] back for a checkup in 6 months  Stop by the first floor and schedule a mammogram

## 2022-05-29 NOTE — Progress Notes (Signed)
Subjective:    Patient ID: Ruth Tucker, female    DOB: 04/26/1971, 52 y.o.   MRN: [id number]  DOS:  05/29/2022 Type of visit - description: CPX  Here for CPX. Has some additional complaints. Chronic constipation, still an issue. She is very active, exercises regularly, has episodic pain at the left knee, ankle and foot.  No swelling, no injury. Also no history of broken sleep.  Fall asleep okay but wakes up multiple times during the night. Admits to mild snoring. She wakes up tired.   Review of Systems  Other than above, a 14 point review of systems is negative    Past Medical History:  Diagnosis Date   COVID 05/30/2020   FH: colon polyps    Thyroid disease    hx enlarged thyroid--US normal   Varicella     Past Surgical History:  Procedure Laterality Date   adenitis mesenterica, 3 laparoscopies  2003   3 surgeries ([place])--   CESAREAN SECTION     2005 and 2008   PELVIC LAPAROSCOPY     uterine polyp removal ([place])     Social History   Socioeconomic History   Marital status: Married    Spouse name: Not on file   Number of children: 2   Years of education: Not on file   Highest education level: Not on file  Occupational History   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  Tobacco Use   Smoking status: Never   Smokeless tobacco: Never  Substance and Sexual Activity   Alcohol use: Yes    Alcohol/week: 4.0 - 6.0 standard drinks of alcohol    Types: 4 - 6 Glasses of wine per week    Comment: social   Drug use: Never   Sexual activity: Yes    Birth control/protection: None  Other Topics Concern   Not on file  Social History Narrative   Original from [place], moved to [place] from [place] 2020   Social Determinants of Health   Financial Resource Strain: Not on file  Food Insecurity: Not on file  Transportation Needs: Not on file  Physical Activity: Not on file  Stress: Not on file  Social Connections: Not on file  Intimate Partner Violence: Not on file      Current Outpatient Medications  Medication Instructions   COLLAGEN PO Oral   Glucosamine-Chondroitin (OSTEO BI-FLEX REGULAR STRENGTH PO) Oral, 2 times daily   psyllium (METAMUCIL) 58.6 % packet 1 packet, Oral, Daily       Objective:   Physical Exam BP 116/66   Pulse 63   Temp 97.8 F (36.6 C) (Oral)   Resp 16   Ht 5\' 9"  (1.753 m)   Wt 156 lb (70.8 kg)   LMP 05/14/2022 (Approximate)   SpO2 99%   BMI 23.04 kg/m  General: Well developed, NAD, BMI noted Neck: No  thyromegaly  HEENT:  Normocephalic . Face symmetric, atraumatic Lungs:  CTA B Normal respiratory effort, no intercostal retractions, no accessory muscle use. Heart: RRR,  no murmur.  Abdomen:  Not distended, soft, non-tender. No rebound or rigidity.   Lower extremities: no pretibial edema bilaterally.  Inspection and palpation of the knee, ankle: Normal bilaterally Skin: Exposed areas without rash. Not pale. Not jaundice Neurologic:  alert & oriented X3.  Speech normal, gait appropriate for age and unassisted Strength symmetric and appropriate for age.  Psych: Cognition and judgment appear intact.  Cooperative with normal attention span and concentration.  Behavior appropriate. No anxious or depressed appearing.  Assessment     Assessment (new patient 03-2020) Healthy, not on BCP US Thyroid 04-2020 wnl Chronic constipation  PLAN Here for CPX. We also discussed the following Constipation Saw GI 04/21/2021 for chronic constipation, recommended increase fiber intake, MiraLAX and a colonoscopy, consider Linzess.  Patient is taking fiber but is reluctant to take MiraLAX daily.  Does not like to take much medicine.  I recommend to continue fiber, take MiraLAX as needed.  Rec to reach out to GI if she decides to pursue Linzess. Knee ankle pain: She is extremely active, exam is benign, recommend to continue exercising, OTCs as needed, call if symptoms increase. Insomnia: Described as broken sleep,  wakes up unrested.  Mild snoring, Epworth scale 14.  Although her phenotype does not put her at risk for sleep apnea, she does have symptoms and the screen is positive. Plan:  Refer to neuro, r/o OSA RTC 6 months   In addition to CPX, we address 3 additional problems.

## 2022-06-12 ENCOUNTER — Encounter (HOSPITAL_BASED_OUTPATIENT_CLINIC_OR_DEPARTMENT_OTHER): Payer: Self-pay

## 2022-06-12 ENCOUNTER — Ambulatory Visit (HOSPITAL_BASED_OUTPATIENT_CLINIC_OR_DEPARTMENT_OTHER)
Admission: RE | Admit: 2022-06-12 | Discharge: 2022-06-12 | Disposition: A | Discharge status: Home / Self Care | Payer: BC Managed Care – PPO | Source: Ambulatory Visit | Attending: Internal Medicine | Admitting: Internal Medicine

## 2022-06-12 DIAGNOSIS — Z1231 Encounter for screening mammogram for malignant neoplasm of breast: Secondary | ICD-10-CM | POA: Diagnosis not present

## 2022-07-03 DIAGNOSIS — M25562 Pain in left knee: Secondary | ICD-10-CM | POA: Diagnosis not present

## 2022-07-03 DIAGNOSIS — M542 Cervicalgia: Secondary | ICD-10-CM | POA: Diagnosis not present

## 2022-07-03 DIAGNOSIS — M25572 Pain in left ankle and joints of left foot: Secondary | ICD-10-CM | POA: Diagnosis not present

## 2022-07-17 DIAGNOSIS — M542 Cervicalgia: Secondary | ICD-10-CM | POA: Diagnosis not present

## 2022-07-17 DIAGNOSIS — M25572 Pain in left ankle and joints of left foot: Secondary | ICD-10-CM | POA: Diagnosis not present

## 2022-07-17 DIAGNOSIS — M25562 Pain in left knee: Secondary | ICD-10-CM | POA: Diagnosis not present

## 2022-07-30 DIAGNOSIS — M542 Cervicalgia: Secondary | ICD-10-CM | POA: Diagnosis not present

## 2022-07-30 DIAGNOSIS — M25562 Pain in left knee: Secondary | ICD-10-CM | POA: Diagnosis not present

## 2022-07-30 DIAGNOSIS — M25572 Pain in left ankle and joints of left foot: Secondary | ICD-10-CM | POA: Diagnosis not present

## 2022-08-06 DIAGNOSIS — M25562 Pain in left knee: Secondary | ICD-10-CM | POA: Diagnosis not present

## 2022-08-06 DIAGNOSIS — M25572 Pain in left ankle and joints of left foot: Secondary | ICD-10-CM | POA: Diagnosis not present

## 2022-08-06 DIAGNOSIS — M542 Cervicalgia: Secondary | ICD-10-CM | POA: Diagnosis not present

## 2022-08-28 DIAGNOSIS — M25562 Pain in left knee: Secondary | ICD-10-CM | POA: Diagnosis not present

## 2022-08-28 DIAGNOSIS — M25572 Pain in left ankle and joints of left foot: Secondary | ICD-10-CM | POA: Diagnosis not present

## 2022-08-28 DIAGNOSIS — M542 Cervicalgia: Secondary | ICD-10-CM | POA: Diagnosis not present

## 2022-10-22 DIAGNOSIS — M79672 Pain in left foot: Secondary | ICD-10-CM | POA: Diagnosis not present

## 2022-10-22 DIAGNOSIS — M25552 Pain in left hip: Secondary | ICD-10-CM | POA: Diagnosis not present

## 2022-10-29 NOTE — Progress Notes (Deleted)
52 y.o. G22P0012 Married [place] female here for annual exam.    PCP:     No LMP recorded.           Sexually active: {yes no:314532}  The current method of family planning is none.    Exercising: {yes no:314532}  {types:19826} Smoker:  no  Health Maintenance: Pap:  10/03/21 neg: HR HPV neg, 01/2019 normal, HR HPV negative in [place].  History of abnormal Pap:  no MMG:  06/12/22 Breast Density Cat C, BIRADS 1 neg Colonoscopy:  05/10/21 normal BMD:   n/a  Result  n/a TDaP:  04/19/20 Gardasil:   no HIV: 04/20/21 neg Hep C: 04/20/21 neg Screening Labs:  Hb today: ***, Urine today: ***   reports that she has never smoked. She has never used smokeless tobacco. She reports current alcohol use of about 4.0 - 6.0 standard drinks of alcohol per week. She reports that she does not use drugs.  Past Medical History:  Diagnosis Date   COVID 05/30/2020   FH: colon polyps    Thyroid disease    hx enlarged thyroid--US normal   Varicella     Past Surgical History:  Procedure Laterality Date   adenitis mesenterica, 3 laparoscopies  2003   3 surgeries ([place])--   CESAREAN SECTION     2005 and 2008   PELVIC LAPAROSCOPY     uterine polyp removal ([place])      Current Outpatient Medications  Medication Sig Dispense Refill   COLLAGEN PO Take by mouth.     Glucosamine-Chondroitin (OSTEO BI-FLEX REGULAR STRENGTH PO) Take by mouth in the morning and at bedtime.     psyllium (METAMUCIL) 58.6 % packet Take 1 packet by mouth daily.     No current facility-administered medications for this visit.    Family History  Problem Relation Age of Onset   Depression Mother    Hypertension Mother    Asthma Sister    Hypertension Brother    Colon cancer Maternal Aunt    Breast cancer Paternal Aunt    Colon cancer Paternal Aunt    Cancer Maternal Grandmother    Hypertension Maternal Grandmother    Cancer Maternal Grandfather    Hypertension Maternal Grandfather    Hypertension Paternal  Grandmother    Cancer Paternal Grandfather    Hypertension Paternal Grandfather    Hearing loss Son    Colon cancer Other        aunt    Breast cancer Other        aunt   Cancer Other        Ovarian ca   Ovarian cancer Other    Esophageal cancer Neg Hx    Rectal cancer Neg Hx    Stomach cancer Neg Hx     Review of Systems  Exam:   There were no vitals taken for this visit.    General appearance: alert, cooperative and appears stated age Head: normocephalic, without obvious abnormality, atraumatic Neck: no adenopathy, supple, symmetrical, trachea midline and thyroid normal to inspection and palpation Lungs: clear to auscultation bilaterally Breasts: normal appearance, no masses or tenderness, No nipple retraction or dimpling, No nipple discharge or bleeding, No axillary adenopathy Heart: regular rate and rhythm Abdomen: soft, non-tender; no masses, no organomegaly Extremities: extremities normal, atraumatic, no cyanosis or edema Skin: skin color, texture, turgor normal. No rashes or lesions Lymph nodes: cervical, supraclavicular, and axillary nodes normal. Neurologic: grossly normal  Pelvic: External genitalia:  no lesions  No abnormal inguinal nodes palpated.              Urethra:  normal appearing urethra with no masses, tenderness or lesions              Bartholins and Skenes: normal                 Vagina: normal appearing vagina with normal color and discharge, no lesions              Cervix: no lesions              Pap taken: {yes no:314532} Bimanual Exam:  Uterus:  normal size, contour, position, consistency, mobility, non-tender              Adnexa: no mass, fullness, tenderness              Rectal exam: {yes no:314532}.  Confirms.              Anus:  normal sphincter tone, no lesions  Chaperone was present for exam:  ***  Assessment:   Well woman visit with gynecologic exam.   Plan: Mammogram screening discussed. Self breast awareness  reviewed. Pap and HR HPV as above. Guidelines for Calcium, Vitamin D, regular exercise program including cardiovascular and weight bearing exercise.   Follow up annually and prn.   Additional counseling given.  {yes [id number]. _______ minutes face to face time of which over 50% was spent in counseling.    After visit summary provided.    '

## 2022-10-30 DIAGNOSIS — M79672 Pain in left foot: Secondary | ICD-10-CM | POA: Diagnosis not present

## 2022-10-30 DIAGNOSIS — M25552 Pain in left hip: Secondary | ICD-10-CM | POA: Diagnosis not present

## 2022-11-05 ENCOUNTER — Encounter: Payer: Self-pay | Admitting: Internal Medicine

## 2022-11-05 ENCOUNTER — Telehealth: Payer: Self-pay

## 2022-11-05 ENCOUNTER — Ambulatory Visit (INDEPENDENT_AMBULATORY_CARE_PROVIDER_SITE_OTHER): Payer: BC Managed Care – PPO | Admitting: Internal Medicine

## 2022-11-05 VITALS — BP 116/66 | HR 65 | Temp 97.5°F | Resp 16 | Ht 69.0 in | Wt 159.2 lb

## 2022-11-05 DIAGNOSIS — R519 Headache, unspecified: Secondary | ICD-10-CM | POA: Diagnosis not present

## 2022-11-05 DIAGNOSIS — J3489 Other specified disorders of nose and nasal sinuses: Secondary | ICD-10-CM | POA: Diagnosis not present

## 2022-11-05 LAB — POCT URINE PREGNANCY: Preg Test, Ur: NEGATIVE

## 2022-11-05 MED ORDER — ONDANSETRON HCL 8 MG PO TABS: 8.0000 mg | ORAL_TABLET | Freq: Three times a day (TID) | ORAL | 0 refills | Status: DC | PRN

## 2022-11-05 MED ORDER — NAPROXEN 500 MG PO TABS: 500.0000 mg | ORAL_TABLET | Freq: Two times a day (BID) | ORAL | 0 refills | Status: DC | PRN

## 2022-11-05 NOTE — Telephone Encounter (Signed)
Received call report from Hailey at [facility])- regarding CT head w/o.   No acute abnormalities seen Noted mild paranasal sinus dx   They have faxed report to [place].

## 2022-11-05 NOTE — Progress Notes (Unsigned)
   Subjective:    Patient ID: Ruth Tucker, female    DOB: 12/25/1970, 52 y.o.   MRN: [id number]  DOS:  11/05/2022 Type of visit - description: Acute  Chief comp into the is migraine. Since yesterday morning had 4 episodes of headache, intense at times, located at the frontal area, one time was located at the posterior head. Ibuprofen helped the first episode. Tylenol helped to some extent as well. + Photo and phonophobia.  She has relatively similar headaches before, they are infrequent once a year or so. This time however it is more intense and it has happened 4 times in a 24-hour period.  She admits to some sinus congestion and postnasal dripping for the last couple of weeks, she thinks related to allergies, taking Allegra No fever or chills No rash. No neck stiffness No head or neck injury Some nausea but no vomiting. No dizziness or diplopia, vision is slightly blurred at times.  Review of Systems See above   Past Medical History:  Diagnosis Date   COVID 05/30/2020   FH: colon polyps    Thyroid disease    hx enlarged thyroid--US normal   Varicella     Past Surgical History:  Procedure Laterality Date   adenitis mesenterica, 3 laparoscopies  2003   3 surgeries ([place])--   CESAREAN SECTION     2005 and 2008   PELVIC LAPAROSCOPY     uterine polyp removal ([place])      Current Outpatient Medications  Medication Instructions   COLLAGEN PO Oral   Glucosamine-Chondroitin (OSTEO BI-FLEX REGULAR STRENGTH PO) Oral, 2 times daily   psyllium (METAMUCIL) 58.6 % packet 1 packet, Oral, Daily       Objective:   Physical Exam BP 116/66   Pulse 65   Temp (!) 97.5 F (36.4 C) (Oral)   Resp 16   Ht 5\' 9"  (1.753 m)   Wt 159 lb 4 oz (72.2 kg)   LMP 10/22/2022 (Approximate)   BMI 23.52 kg/m  General:   Well developed, NAD, BMI noted. HEENT:  Normocephalic . Face symmetric, atraumatic. Nose slightly congested. Neck: Range of motion normal, supple Lungs:   CTA B Normal respiratory effort, no intercostal retractions, no accessory muscle use. Heart: RRR,  no murmur.  Lower extremities: no pretibial edema bilaterally  Skin: Not pale. Not jaundice Neurologic:  alert & oriented X3.  Speech normal, gait appropriate for age and unassisted Motor symmetric, EOMI, pupils equal and reactive. Psych--  Cognition and judgment appear intact.  Cooperative with normal attention span and concentration.  Behavior appropriate. No anxious or depressed appearing.      Assessment     Assessment (new patient 03-2020) Healthy, not on BCP US Thyroid 04-2020 wnl Chronic constipation  PLAN Headache: As described above, history of migraines which are infrequent. Current headache reminds her of previous migraines however is very intense and described as the worst of her life. Never before prescribed triptans.  Typically respond to NSAIDs. UPT: neg Plan: CT head stat, Dx worst headache of her life Rest, fluids, ER if not improving.  See AVS. Allergies: She does have some nasal congestion, headache however is more consistent with migraines than acute sinusitis.  Rx Flonase and Astepro

## 2022-11-05 NOTE — Telephone Encounter (Signed)
preliminar reports no acute, patient aware. Encouraged to contact me tomorrow if not better.

## 2022-11-05 NOTE — Patient Instructions (Addendum)
Proceed with a CAT scan of the head ( first floor)  Rest  Fluids  Naproxen 500 mg 1 tablet twice daily, always with food.  Zofran if needed for nausea  If you are not getting better in the next 24 hours let me know, we could try a medication such as Imitrex  If you have severe symptoms, severe nausea and vomiting, fever, any rash: Go to the ER   Allergies: Flonase over-the-counter: 2 sprays on each side of the nose daily Astepro over-the-counter: 2 sprays on each side of the nose twice daily

## 2022-11-06 ENCOUNTER — Encounter: Payer: Self-pay | Admitting: Internal Medicine

## 2022-11-06 DIAGNOSIS — H40013 Open angle with borderline findings, low risk, bilateral: Secondary | ICD-10-CM | POA: Diagnosis not present

## 2022-11-06 MED ORDER — PREDNISONE 10 MG PO TABS: ORAL_TABLET | ORAL | 0 refills | Status: DC

## 2022-11-06 MED ORDER — AZITHROMYCIN 250 MG PO TABS: ORAL_TABLET | ORAL | 0 refills | Status: DC

## 2022-11-06 NOTE — Assessment & Plan Note (Signed)
Headache: As described above, history of migraines which are infrequent. Current headache reminds her of previous migraines however is very intense and described as the worst of her life. Never before prescribed triptans.  Typically respond to NSAIDs. UPT: neg Plan: CT head stat, Dx worst headache of her life Rest, fluids, ER if not improving.  See AVS. Allergies: She does have some nasal congestion, headache however is more consistent with migraines than acute sinusitis.  Rx Flonase and Astepro

## 2022-11-06 NOTE — Telephone Encounter (Signed)
Called [facility]- left message asking for CT scan to be refaxed to [phone]

## 2022-11-06 NOTE — Telephone Encounter (Signed)
Patient sent a message, she continue with headaches, she is concerned about the CT showing mild sinus disease, antibiotics?. Plan: Round of prednisone which could treat both migraine and mild sinusitis Zithromax Patient to communicate with me in few days Pending formal report from CT.

## 2022-11-07 NOTE — Telephone Encounter (Signed)
Received, sent for scan.

## 2022-11-08 NOTE — Telephone Encounter (Signed)
Started prednisone and Zithromax 2 days ago, I called the patient today, she is feeling much better. Asked her to keep me posted, if she has any residual symptoms or ongoing headaches she will let me know, referral?

## 2022-11-12 ENCOUNTER — Ambulatory Visit: Payer: BC Managed Care – PPO | Admitting: Obstetrics and Gynecology

## 2022-11-12 DIAGNOSIS — M79672 Pain in left foot: Secondary | ICD-10-CM | POA: Diagnosis not present

## 2022-11-12 DIAGNOSIS — M25552 Pain in left hip: Secondary | ICD-10-CM | POA: Diagnosis not present

## 2022-11-19 DIAGNOSIS — M25552 Pain in left hip: Secondary | ICD-10-CM | POA: Diagnosis not present

## 2022-11-19 DIAGNOSIS — M79672 Pain in left foot: Secondary | ICD-10-CM | POA: Diagnosis not present

## 2022-11-21 DIAGNOSIS — M79672 Pain in left foot: Secondary | ICD-10-CM | POA: Diagnosis not present

## 2022-11-21 DIAGNOSIS — M542 Cervicalgia: Secondary | ICD-10-CM | POA: Diagnosis not present

## 2022-11-27 ENCOUNTER — Ambulatory Visit: Payer: BC Managed Care – PPO | Admitting: Internal Medicine

## 2022-12-04 ENCOUNTER — Encounter: Payer: Self-pay | Admitting: Internal Medicine

## 2022-12-04 ENCOUNTER — Ambulatory Visit (INDEPENDENT_AMBULATORY_CARE_PROVIDER_SITE_OTHER): Payer: BC Managed Care – PPO | Admitting: Internal Medicine

## 2022-12-04 VITALS — BP 116/68 | HR 64 | Temp 98.4°F | Resp 16 | Ht 69.0 in | Wt 162.1 lb

## 2022-12-04 DIAGNOSIS — T7840XD Allergy, unspecified, subsequent encounter: Secondary | ICD-10-CM | POA: Diagnosis not present

## 2022-12-04 DIAGNOSIS — G4700 Insomnia, unspecified: Secondary | ICD-10-CM

## 2022-12-04 DIAGNOSIS — Z0189 Encounter for other specified special examinations: Secondary | ICD-10-CM

## 2022-12-04 NOTE — Progress Notes (Unsigned)
   Subjective:    Patient ID: Ruth Tucker, female    DOB: March 18, 1971, 52 y.o.   MRN: [id number]  DOS:  12/04/2022 Type of visit - description: f/u  Routine follow-up. Recently seen with a headache.  They are resolved. Is her observation that as long as she gets Flonase and control her allergies the headache is not coming back.  Continue with insomnia  Complains of weight gain, menopausal?  Menses are at baseline, they are typically regular. Review of Systems See above   Past Medical History:  Diagnosis Date   COVID 05/30/2020   FH: colon polyps    Thyroid disease    hx enlarged thyroid--US normal   Varicella     Past Surgical History:  Procedure Laterality Date   adenitis mesenterica, 3 laparoscopies  2003   3 surgeries ([place])--   CESAREAN SECTION     2005 and 2008   PELVIC LAPAROSCOPY     uterine polyp removal ([place])      Current Outpatient Medications  Medication Instructions   azithromycin (ZITHROMAX Z-PAK) 250 MG tablet 2 tabs a day the first day, then 1 tab a day x 4 days   COLLAGEN PO Oral   Glucosamine-Chondroitin (OSTEO BI-FLEX REGULAR STRENGTH PO) Oral, 2 times daily   naproxen (NAPROSYN) 500 mg, Oral, 2 times daily PRN   ondansetron (ZOFRAN) 8 mg, Oral, Every 8 hours PRN   predniSONE (DELTASONE) 10 MG tablet 4 tablets x 2 days, 3 tabs x 2 days, 2 tabs x 2 days, 1 tab x 2 days   psyllium (METAMUCIL) 58.6 % packet 1 packet, Oral, Daily       Objective:   Physical Exam BP 116/68   Pulse 64   Temp 98.4 F (36.9 C) (Oral)   Resp 16   Ht 5\' 9"  (1.753 m)   Wt 162 lb 2 oz (73.5 kg)   LMP 10/22/2022 (Approximate)   SpO2 97%   BMI 23.94 kg/m  General:   Well developed, NAD, BMI noted. HEENT:  Normocephalic . Face symmetric, atraumatic Lungs:  CTA B Normal respiratory effort, no intercostal retractions, no accessory muscle use. Heart: RRR,  no murmur.  Lower extremities: no pretibial edema bilaterally  Skin: Not pale. Not  jaundice Neurologic:  alert & oriented X3.  Speech normal, gait appropriate for age and unassisted Psych--  Cognition and judgment appear intact.  Cooperative with normal attention span and concentration.  Behavior appropriate. No anxious or depressed appearing.      Assessment     Assessment (new patient 03-2020) Healthy, not on BCP US Thyroid 04-2020 wnl Chronic constipation  PLAN  Headache: See LOV, CT showed no acute intercranial normality but some sinus disease, she eventually got a round of prednisone and Zithromax which quickly improved her symptoms. Is her observation that as long as she control her sinus congestion with Flonase she is headache free.  Plan: Continue Flonase. Allergies: See above Weight gain: Reports no change in diet, + weight gain, she thinks it may be related to menopause?  plans to see gynecology soon.  Recent TSH and A1c normal. Insomnia: Continue with insomnia, minimal snoring, some fatigue.  Was referred to neurology, encouraged to call them for further eval. Vaccine advice provided. RTC CPX 05-2023

## 2022-12-04 NOTE — Patient Instructions (Addendum)
Recommend to call neurology to get an appointment and see the sleep specialist   [phone]  Vaccines I recommend; Covid booster Shingrix (shingles) Flu shot this fall      GO TO THE FRONT DESK, PLEASE SCHEDULE YOUR APPOINTMENTS Come back for   a physical by 05-2022

## 2022-12-05 DIAGNOSIS — M542 Cervicalgia: Secondary | ICD-10-CM | POA: Diagnosis not present

## 2022-12-05 DIAGNOSIS — M79672 Pain in left foot: Secondary | ICD-10-CM | POA: Diagnosis not present

## 2022-12-05 DIAGNOSIS — G4700 Insomnia, unspecified: Secondary | ICD-10-CM | POA: Insufficient documentation

## 2022-12-05 DIAGNOSIS — T7840XA Allergy, unspecified, initial encounter: Secondary | ICD-10-CM | POA: Insufficient documentation

## 2022-12-05 NOTE — Assessment & Plan Note (Signed)
Headache: See LOV, CT showed no acute intercranial normality but some sinus disease, she eventually got a round of prednisone and Zithromax which quickly improved her symptoms. Is her observation that as long as she control her sinus congestion with Flonase she is headache free.  Plan: Continue Flonase. Allergies: See above Weight gain: Reports no change in diet, + weight gain, she thinks it may be related to menopause?  plans to see gynecology soon.  Recent TSH and A1c normal. Insomnia: Continue with insomnia, minimal snoring, some fatigue.  Was referred to neurology, encouraged to call them for further eval. Vaccine advice provided. RTC CPX 05-2023

## 2022-12-11 DIAGNOSIS — M542 Cervicalgia: Secondary | ICD-10-CM | POA: Diagnosis not present

## 2022-12-11 DIAGNOSIS — M79672 Pain in left foot: Secondary | ICD-10-CM | POA: Diagnosis not present

## 2022-12-19 DIAGNOSIS — M542 Cervicalgia: Secondary | ICD-10-CM | POA: Diagnosis not present

## 2022-12-19 DIAGNOSIS — M79672 Pain in left foot: Secondary | ICD-10-CM | POA: Diagnosis not present

## 2022-12-19 NOTE — Progress Notes (Signed)
52 y.o. [id number] Married [place] female here for annual exam.    Had a migraine headache that lasted days.  Saw her PCP.   Experiencing weight gain.   Last period was heavy.  Feeling some heat at night.   PCP:  Elita Quick Paz,MD  Patient's last menstrual period was 01/01/2023.     Period Cycle (Days): 28 Period Duration (Days): 4-5 Period Pattern: Regular Menstrual Flow: Heavy, Moderate Menstrual Control: Maxi pad, Tampon Dysmenorrhea: (!) Moderate     Sexually active: Yes.    The current method of family planning is none.    Exercising: Yes.     dancing Smoker:  no  Health Maintenance: Pap:  10/03/21 neg: HR HPV neg History of abnormal Pap:  no MMG:  06/12/22 Breast Density Cat C, BI-RADS CAT 1 neg Colonoscopy:  05/10/21 - normal  - due in 10 years.  BMD:   n/a  Result  n/a TDaP:  04/19/20 Gardasil:   no HIV: 04/20/21 NR Hep C: 04/20/21 NR Screening Labs:  PCP   reports that she has never smoked. She has never used smokeless tobacco. She reports current alcohol use of about 4.0 - 6.0 standard drinks of alcohol per week. She reports that she does not use drugs.  Past Medical History:  Diagnosis Date   COVID 05/30/2020   FH: colon polyps    Thyroid disease    hx enlarged thyroid--US normal   Varicella     Past Surgical History:  Procedure Laterality Date   adenitis mesenterica, 3 laparoscopies  2003   3 surgeries ([place])--   CESAREAN SECTION     2005 and 2008   PELVIC LAPAROSCOPY     uterine polyp removal ([place])      Current Outpatient Medications  Medication Sig Dispense Refill   COLLAGEN PO Take by mouth.     Glucosamine-Chondroitin (OSTEO BI-FLEX REGULAR STRENGTH PO) Take by mouth in the morning and at bedtime.     MAGNESIUM GLYCINATE PO Take by mouth.     psyllium (METAMUCIL) 58.6 % packet Take 1 packet by mouth daily. (Patient not taking: Reported on 01/02/2023)     No current facility-administered medications for this visit.    Family History   Problem Relation Age of Onset   Depression Mother    Hypertension Mother    Asthma Sister    Hypertension Brother    Colon cancer Maternal Aunt    Breast cancer Paternal Aunt    Colon cancer Paternal Aunt    Cancer Maternal Grandmother    Hypertension Maternal Grandmother    Cancer Maternal Grandfather    Hypertension Maternal Grandfather    Hypertension Paternal Grandmother    Cancer Paternal Grandfather    Hypertension Paternal Grandfather    Hearing loss Son    Colon cancer Other        aunt    Breast cancer Other        aunt   Cancer Other        Ovarian ca   Ovarian cancer Other    Esophageal cancer Neg Hx    Rectal cancer Neg Hx    Stomach cancer Neg Hx     Review of Systems  All other systems reviewed and are negative.   Exam:   BP 124/80 (BP Location: Left Arm, Patient Position: Sitting, Cuff Size: Normal)   Pulse 62   Ht 5' 9.25" (1.759 m)   Wt 165 lb (74.8 kg)   LMP 01/01/2023   SpO2 99%  BMI 24.19 kg/m     General appearance: alert, cooperative and appears stated age Head: normocephalic, without obvious abnormality, atraumatic Neck: no adenopathy, supple, symmetrical, trachea midline and thyroid normal to inspection and palpation Lungs: clear to auscultation bilaterally Breasts: normal appearance, no masses or tenderness, No nipple retraction or dimpling, No nipple discharge or bleeding, No axillary adenopathy Heart: regular rate and rhythm Abdomen: soft, non-tender; no masses, no organomegaly Extremities: extremities normal, atraumatic, no cyanosis or edema Skin: skin color, texture, turgor normal. No rashes or lesions Lymph nodes: cervical, supraclavicular, and axillary nodes normal. Neurologic: grossly normal  Pelvic: External genitalia:  no lesions              No abnormal inguinal nodes palpated.              Urethra:  normal appearing urethra with no masses, tenderness or lesions              Bartholins and Skenes: normal                  Vagina: normal appearing vagina with normal color and discharge, no lesions              Cervix: no lesions.  Menstrual blood.               Pap taken: no Bimanual Exam:  Uterus:  normal size, contour, position, consistency, mobility, non-tender              Adnexa: no mass, fullness, tenderness              Rectal exam: yes.  Confirms.              Anus:  normal sphincter tone, no lesions  Chaperone was present for exam:  Warren Lacy, CMA  Assessment:   Well woman visit with gynecologic exam. Weight gain.  Plan: Mammogram screening discussed. Self breast awareness reviewed. Pap and HR HPV 2028. Guidelines for Calcium, Vitamin D, regular exercise program including cardiovascular and weight bearing exercise. We discussed formal weight loss programs in [place].  We reviewed Estroven as a natural product to treat hot flashes. Follow up annually and prn.   After visit summary provided.

## 2023-01-02 ENCOUNTER — Ambulatory Visit (INDEPENDENT_AMBULATORY_CARE_PROVIDER_SITE_OTHER): Payer: BC Managed Care – PPO | Admitting: Obstetrics and Gynecology

## 2023-01-02 ENCOUNTER — Encounter: Payer: Self-pay | Admitting: Obstetrics and Gynecology

## 2023-01-02 VITALS — BP 124/80 | HR 62 | Ht 69.25 in | Wt 165.0 lb

## 2023-01-02 DIAGNOSIS — Z01419 Encounter for gynecological examination (general) (routine) without abnormal findings: Secondary | ICD-10-CM

## 2023-01-02 NOTE — Patient Instructions (Addendum)
Try Jeffie Pollock as a natural way to treat hot flashes.   EXERCISE AND DIET:  We recommended that you start or continue a regular exercise program for good health. Regular exercise means any activity that makes your heart beat faster and makes you sweat.  We recommend exercising at least 30 minutes per day at least 3 days a week, preferably 4 or 5.  We also recommend a diet low in fat and sugar.  Inactivity, poor dietary choices and obesity can cause diabetes, heart attack, stroke, and kidney damage, among others.    ALCOHOL AND SMOKING:  Women should limit their alcohol intake to no more than 7 drinks/beers/glasses of wine (combined, not each!) per week. Moderation of alcohol intake to this level decreases your risk of breast cancer and liver damage. And of course, no recreational drugs are part of a healthy lifestyle.  And absolutely no smoking or even second hand smoke. Most people know smoking can cause heart and lung diseases, but did you know it also contributes to weakening of your bones? Aging of your skin?  Yellowing of your teeth and nails?  CALCIUM AND VITAMIN D:  Adequate intake of calcium and Vitamin D are recommended.  The recommendations for exact amounts of these supplements seem to change often, but generally speaking 1200 mg of calcium (either carbonate or citrate) and 800 units of Vitamin D per day seems prudent. Certain women may benefit from higher intake of Vitamin D.  If you are among these women, your doctor will have told you during your visit.    PAP SMEARS:  Pap smears, to check for cervical cancer or precancers,  have traditionally been done yearly, although recent scientific advances have shown that most women can have pap smears less often.  However, every woman still should have a physical exam from her gynecologist every year. It will include a breast check, inspection of the vulva and vagina to check for abnormal growths or skin changes, a visual exam of the cervix, and then an  exam to evaluate the size and shape of the uterus and ovaries.  And after 50 years of age, a rectal exam is indicated to check for rectal cancers. We will also provide age appropriate advice regarding health maintenance, like when you should have certain vaccines, screening for sexually transmitted diseases, bone density testing, colonoscopy, mammograms, etc.   MAMMOGRAMS:  All women over 85 years old should have a yearly mammogram. Many facilities now offer a "3D" mammogram, which may cost around $50 extra out of pocket. If possible,  we recommend you accept the option to have the 3D mammogram performed.  It both reduces the number of women who will be called back for extra views which then turn out to be normal, and it is better than the routine mammogram at detecting truly abnormal areas.    COLONOSCOPY:  Colonoscopy to screen for colon cancer is recommended for all women at age 86.  We know, you hate the idea of the prep.  We agree, BUT, having colon cancer and not knowing it is worse!!  Colon cancer so often starts as a polyp that can be seen and removed at colonscopy, which can quite literally save your life!  And if your first colonoscopy is normal and you have no family history of colon cancer, most women don't have to have it again for 10 years.  Once every ten years, you can do something that may end up saving your life, right?  We will  be happy to help you get it scheduled when you are ready.  Be sure to check your insurance coverage so you understand how much it will cost.  It may be covered as a preventative service at no cost, but you should check your particular policy.

## 2023-01-09 DIAGNOSIS — M542 Cervicalgia: Secondary | ICD-10-CM | POA: Diagnosis not present

## 2023-01-09 DIAGNOSIS — M79672 Pain in left foot: Secondary | ICD-10-CM | POA: Diagnosis not present

## 2023-01-16 ENCOUNTER — Encounter: Payer: Self-pay | Admitting: Sports Medicine

## 2023-01-16 ENCOUNTER — Ambulatory Visit: Payer: BC Managed Care – PPO | Admitting: Sports Medicine

## 2023-01-16 VITALS — BP 104/62 | Ht 69.25 in | Wt 165.0 lb

## 2023-01-16 DIAGNOSIS — M7741 Metatarsalgia, right foot: Secondary | ICD-10-CM

## 2023-01-16 DIAGNOSIS — M7742 Metatarsalgia, left foot: Secondary | ICD-10-CM | POA: Diagnosis not present

## 2023-01-16 NOTE — Progress Notes (Addendum)
   Subjective:    Patient ID: Ruth Tucker, female    DOB: 05-21-1971, 52 y.o.   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  She localizes the pain to the area of the second metatarsal head.  She has had occasional callus formation in this area which she has had to shave down.  Her pain was initially noticeable when wearing her dance shoes.  She enjoys Secondary school teacher.  She now endorses pain with walking.  No pain on the dorsum of the foot.  She does experience similar pain in the right foot but not as severe.  No injury.  Past medical history reviewed Medications reviewed Allergies reviewed    Review of Systems As above    Objective:   Physical Exam  Well-developed, well-nourished.  No acute distress  Examination of both feet show collapse of the transverse arch bilaterally.  Left foot has a small callus on the plantar aspect of the second metatarsal head.  No significant tenderness to palpation.  No swelling on the dorsum of the foot.  Well-preserved longitudinal arches.  Good pulses.      Assessment & Plan:   Left foot pain secondary to metatarsalgia Bilateral transverse arch collapse  We will try a green sports insole with a metatarsal pad for her tennis shoes.  Unfortunately, I do not have any sort of insert that we will fit into her dance shoes but hopefully her inflammation will settle enough with her metatarsal pads that she is able to dance comfortably enough.  Alternatively, she could also try to pad the forefoot of her dance shoes if possible.  She will follow-up as needed.  This note was dictated using Dragon naturally speaking software and may contain errors in syntax, spelling, or content which have not been identified prior to signing this note.

## 2023-01-28 ENCOUNTER — Encounter: Payer: Self-pay | Admitting: Neurology

## 2023-01-28 ENCOUNTER — Ambulatory Visit (INDEPENDENT_AMBULATORY_CARE_PROVIDER_SITE_OTHER): Payer: BC Managed Care – PPO | Admitting: Neurology

## 2023-01-28 VITALS — BP 99/56 | HR 74 | Ht 69.0 in | Wt 163.0 lb

## 2023-01-28 DIAGNOSIS — R635 Abnormal weight gain: Secondary | ICD-10-CM

## 2023-01-28 DIAGNOSIS — Z9189 Other specified personal risk factors, not elsewhere classified: Secondary | ICD-10-CM

## 2023-01-28 DIAGNOSIS — G478 Other sleep disorders: Secondary | ICD-10-CM | POA: Diagnosis not present

## 2023-01-28 DIAGNOSIS — R0683 Snoring: Secondary | ICD-10-CM | POA: Diagnosis not present

## 2023-01-28 DIAGNOSIS — Z820 Family history of epilepsy and other diseases of the nervous system: Secondary | ICD-10-CM

## 2023-01-28 DIAGNOSIS — G4700 Insomnia, unspecified: Secondary | ICD-10-CM

## 2023-01-28 NOTE — Patient Instructions (Signed)

## 2023-01-28 NOTE — Progress Notes (Signed)
Subjective:    Patient ID: Ruth Tucker is a 52 y.o. female.  HPI    Ruth Foley, MD, PhD [facility] [address] [address] [place], [place] [address]  Dear Dr. Drue Novel,  I saw your patient, Ruth Tucker, upon your kind request in my sleep clinic today for initial consultation of her sleep disorder, in particular, concern for underlying obstructive sleep apnea.  The patient is unaccompanied today.  As you know, Ms. Soutto Ruth Tucker is a 52 year old female with an underlying medical history of allergies, enlarged thyroid, and headaches, who reports snoring and difficulty maintaining sleep for the past couple of years.  She has had nonrestorative sleep.  No difficulty falling asleep. Her Epworth sleepiness score is 5 out of 24, fatigue severity score is 25 out of 63.  I reviewed your office note from 12/04/2022.Marland Kitchen  She usually goes to bed around 9:30 PM and falls asleep fairly quickly.  They do have a TV in the bedroom but it is generally not on at night.  She has multiple nighttime awakenings, generally is able to go back to sleep fairly quickly but sleep is interrupted which bothers her.  She has tried melatonin in the past which did not really help.  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  Her mom has sleep apnea but could not tolerate a PAP machine.  She lives with her husband and her 68 year old daughter who is a Medical laboratory scientific officer in high school.  Her 80 year old son just went off to college as a Printmaker at [employer]  They have no pets in the household.  She tries to keep good sleep hygiene.  She has caffeine in the form of coffee, usually black coffee, 2 to 3 cups in the morning and sometimes a 4th cup in the afternoon.  She has reduced her alcohol intake to once a week from previously 2-3 times a week.,  She will have up to 2 drinks at a time.  She denies recurrent nocturnal or morning headaches or nightly nocturia.  She  has gained weight in the realm of 12 pounds in the past 8 or 9 months. She uses a retainer at night.  She had braces as a teenager.  Her Past Medical History Is Significant For: Past Medical History:  Diagnosis Date   COVID 05/30/2020   FH: colon polyps    Thyroid disease    hx enlarged thyroid--US normal   Varicella     Her Past Surgical History Is Significant For: Past Surgical History:  Procedure Laterality Date   adenitis mesenterica, 3 laparoscopies  2003   3 surgeries ([place])--   CESAREAN SECTION     2005 and 2008   PELVIC LAPAROSCOPY     uterine polyp removal ([place])      Her Family History Is Significant For: Family History  Problem Relation Age of Onset   Depression Mother    Hypertension Mother    Sleep apnea Mother    Asthma Sister    Hypertension Brother    Colon cancer Maternal Aunt    Breast cancer Paternal Aunt    Colon cancer Paternal Aunt    Cancer Maternal Grandmother    Hypertension Maternal Grandmother    Cancer Maternal Grandfather    Hypertension Maternal Grandfather    Hypertension Paternal Grandmother    Cancer Paternal Grandfather    Hypertension Paternal Grandfather    Hearing loss Son    Colon  cancer Other        aunt    Breast cancer Other        aunt   Cancer Other        Ovarian ca   Ovarian cancer Other    Esophageal cancer Neg Hx    Rectal cancer Neg Hx    Stomach cancer Neg Hx     Her Social History Is Significant For: Social History   Socioeconomic History   Marital status: Married    Spouse name: Not on file   Number of children: 2   Years of education: Not on file   Highest education level: Not on file  Occupational History   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  Tobacco Use   Smoking status: Never   Smokeless tobacco: Never  Substance and Sexual Activity   Alcohol use: Yes    Alcohol/week: 2.0 standard drinks of alcohol    Types: 2 Shots of liquor per week    Comment: social   Drug use: Never   Sexual activity:  Yes    Birth control/protection: None  Other Topics Concern   Not on file  Social History Narrative   Original from [place], moved to [place] from [place] 2020   Social Determinants of Health   Financial Resource Strain: Not on file  Food Insecurity: Not on file  Transportation Needs: Not on file  Physical Activity: Not on file  Stress: Not on file  Social Connections: Not on file    Her Allergies Are:  No Known Allergies:   Her Current Medications Are:  Outpatient Encounter Medications as of 01/28/2023  Medication Sig   COLLAGEN PO Take by mouth.   Glucosamine-Chondroitin (OSTEO BI-FLEX REGULAR STRENGTH PO) Take by mouth in the morning and at bedtime.   MAGNESIUM GLYCINATE PO Take by mouth.   psyllium (METAMUCIL) 58.6 % packet Take 1 packet by mouth as needed.   No facility-administered encounter medications on file as of 01/28/2023.  :   Review of Systems:  Out of a complete 14 point review of systems, all are reviewed and negative with the exception of these symptoms as listed below:  Review of Systems  Neurological:        Pt here for sleep consult Pt snores,fatigue,some headaches Pt denies sleep study,CPAP machine,hypertension     ESS:5 FSS:25    Objective:  Neurological Exam  Physical Exam Physical Examination:   Vitals:   01/28/23 1343  BP: (!) 99/56  Pulse: 74    General Examination: The patient is a very pleasant 52 y.o. female in no acute distress. She appears well-developed and well-nourished and well groomed.   HEENT: Normocephalic, atraumatic, pupils are equal, round and reactive to light, extraocular tracking is good without limitation to gaze excursion or nystagmus noted. Hearing is grossly intact. Face is symmetric with normal facial animation. Speech is clear with no dysarthria noted. There is no hypophonia. There is no lip, neck/head, jaw or voice tremor. Neck is supple with full range of passive and active motion. There are no carotid bruits on  auscultation. Oropharynx exam reveals: No significant mouth dryness, good dental hygiene, mild airway crowding secondary to longer uvula, Mallampati class II.  Tonsils 1+ bilaterally.  Tongue protrudes centrally and palate elevates symmetrically, mild to moderate overbite.   Chest: Clear to auscultation without wheezing, rhonchi or crackles noted.  Heart: S1+S2+0, regular and normal without murmurs, rubs or gallops noted.   Abdomen: Soft, non-tender and non-distended.  Extremities: There is no pitting  edema in the distal lower extremities bilaterally.   Skin: Warm and dry without trophic changes noted.   Musculoskeletal: exam reveals no obvious joint deformities.   Neurologically:  Mental status: The patient is awake, alert and oriented in all 4 spheres. Her immediate and remote memory, attention, language skills and fund of knowledge are appropriate. There is no evidence of aphasia, agnosia, apraxia or anomia. Speech is clear with normal prosody and enunciation. Thought process is linear. Mood is normal and affect is normal.  Cranial nerves II - XII are as described above under HEENT exam.  Motor exam: Normal bulk, strength and tone is noted. There is no obvious action or resting tremor.  Fine motor skills and coordination: grossly intact.  Cerebellar testing: No dysmetria or intention tremor. There is no truncal or gait ataxia.  Sensory exam: intact to light touch in the upper and lower extremities.  Gait, station and balance: She stands easily. No veering to one side is noted. No leaning to one side is noted. Posture is age-appropriate and stance is narrow based. Gait shows normal stride length and normal pace. No problems turning are noted.   Assessment and Plan:  In summary, Bristyn Penny is a very pleasant 52 y.o.-year old female with an underlying medical history of allergies, enlarged thyroid, and headaches, whose history and physical exam are concerning for sleep disordered  breathing, particularly obstructive sleep apnea (OSA). A laboratory attended sleep study is typically considered "gold standard" for evaluation of sleep disordered breathing.   I had a long chat with the patient about my findings and the diagnosis of sleep apnea, particularly OSA, its prognosis and treatment options. We talked about medical/conservative treatments, surgical interventions and non-pharmacological approaches for symptom control. I explained, in particular, the risks and ramifications of untreated moderate to severe OSA, especially with respect to developing cardiovascular disease down the road, including congestive heart failure (CHF), difficult to treat hypertension, cardiac arrhythmias (particularly A-fib), neurovascular complications including TIA, stroke and dementia. Even type 2 diabetes has, in part, been linked to untreated OSA. Symptoms of untreated OSA may include (but may not be limited to) daytime sleepiness, nocturia (i.e. frequent nighttime urination), memory problems, mood irritability and suboptimally controlled or worsening mood disorder such as depression and/or anxiety, lack of energy, lack of motivation, physical discomfort, as well as recurrent headaches, especially morning or nocturnal headaches. We talked about the importance of maintaining a healthy lifestyle and striving for healthy weight. In addition, we talked about the importance of striving for and maintaining good sleep hygiene. I recommended a sleep study at this time. I outlined the differences between a laboratory attended sleep study which is considered more comprehensive and accurate over the option of a home sleep test (HST); the latter may lead to underestimation of sleep disordered breathing in some instances and does not help with diagnosing upper airway resistance syndrome and is not accurate enough to diagnose primary central sleep apnea typically. I outlined possible surgical and non-surgical treatment  options of OSA, including the use of a positive airway pressure (PAP) device (i.e. CPAP, AutoPAP/APAP or BiPAP in certain circumstances), a custom-made dental device (aka oral appliance, which would require a referral to a specialist dentist or orthodontist typically, and is generally speaking not considered for patients with full dentures or edentulous state), upper airway surgical options, such as traditional UPPP (which is not considered a first-line treatment) or the Inspire device (hypoglossal nerve stimulator, which would involve a referral for consultation with an ENT  surgeon, after careful selection, following inclusion criteria - also not first-line treatment). I explained the PAP treatment option to the patient in detail, as this is generally considered first-line treatment.  The patient indicated that she would be willing to try PAP therapy, if the need arises. I explained the importance of being compliant with PAP treatment, not only for insurance purposes but primarily to improve patient's symptoms symptoms, and for the patient's long term health benefit, including to reduce Her cardiovascular risks longer-term.    She may not be a great candidate for an oral appliance as she uses a retainer at night.  Nevertheless, we will pick up our discussion about the next steps and treatment options after testing.  We will keep her posted as to the test results by phone call and/or MyChart messaging where possible.  We will plan to follow-up in sleep clinic accordingly as well.  I answered all her questions today and the patient was in agreement.   I encouraged her to call with any interim questions, concerns, problems or updates or email [place] through MyChart.  Generally speaking, sleep test authorizations may take up to 2 weeks, sometimes less, sometimes longer, the patient is encouraged to get in touch with [place] if they do not hear back from the sleep lab staff directly within the next 2 weeks.  Thank you very  much for allowing me to participate in the care of this nice patient. If I can be of any further assistance to you please do not hesitate to call me at [phone].  Sincerely,   Ruth Foley, MD, PhD

## 2023-02-01 DIAGNOSIS — R6882 Decreased libido: Secondary | ICD-10-CM | POA: Diagnosis not present

## 2023-02-01 DIAGNOSIS — Z136 Encounter for screening for cardiovascular disorders: Secondary | ICD-10-CM | POA: Diagnosis not present

## 2023-02-01 DIAGNOSIS — N951 Menopausal and female climacteric states: Secondary | ICD-10-CM | POA: Diagnosis not present

## 2023-02-01 DIAGNOSIS — Z6823 Body mass index (BMI) 23.0-23.9, adult: Secondary | ICD-10-CM | POA: Diagnosis not present

## 2023-02-01 DIAGNOSIS — R5382 Chronic fatigue, unspecified: Secondary | ICD-10-CM | POA: Diagnosis not present

## 2023-02-01 DIAGNOSIS — Z131 Encounter for screening for diabetes mellitus: Secondary | ICD-10-CM | POA: Diagnosis not present

## 2023-02-01 DIAGNOSIS — R635 Abnormal weight gain: Secondary | ICD-10-CM | POA: Diagnosis not present

## 2023-02-01 DIAGNOSIS — R4586 Emotional lability: Secondary | ICD-10-CM | POA: Diagnosis not present

## 2023-02-05 DIAGNOSIS — Z1339 Encounter for screening examination for other mental health and behavioral disorders: Secondary | ICD-10-CM | POA: Diagnosis not present

## 2023-02-05 DIAGNOSIS — Z6824 Body mass index (BMI) 24.0-24.9, adult: Secondary | ICD-10-CM | POA: Diagnosis not present

## 2023-02-05 DIAGNOSIS — R232 Flushing: Secondary | ICD-10-CM | POA: Diagnosis not present

## 2023-02-05 DIAGNOSIS — N951 Menopausal and female climacteric states: Secondary | ICD-10-CM | POA: Diagnosis not present

## 2023-02-05 DIAGNOSIS — G479 Sleep disorder, unspecified: Secondary | ICD-10-CM | POA: Diagnosis not present

## 2023-02-05 DIAGNOSIS — Z1331 Encounter for screening for depression: Secondary | ICD-10-CM | POA: Diagnosis not present

## 2023-02-07 DIAGNOSIS — M79672 Pain in left foot: Secondary | ICD-10-CM | POA: Diagnosis not present

## 2023-02-07 DIAGNOSIS — M542 Cervicalgia: Secondary | ICD-10-CM | POA: Diagnosis not present

## 2023-02-19 ENCOUNTER — Ambulatory Visit: Payer: BC Managed Care – PPO | Admitting: Neurology

## 2023-02-19 DIAGNOSIS — G4733 Obstructive sleep apnea (adult) (pediatric): Secondary | ICD-10-CM | POA: Diagnosis not present

## 2023-02-19 DIAGNOSIS — Z820 Family history of epilepsy and other diseases of the nervous system: Secondary | ICD-10-CM

## 2023-02-19 DIAGNOSIS — M542 Cervicalgia: Secondary | ICD-10-CM | POA: Diagnosis not present

## 2023-02-19 DIAGNOSIS — R635 Abnormal weight gain: Secondary | ICD-10-CM

## 2023-02-19 DIAGNOSIS — Z9189 Other specified personal risk factors, not elsewhere classified: Secondary | ICD-10-CM

## 2023-02-19 DIAGNOSIS — G4700 Insomnia, unspecified: Secondary | ICD-10-CM

## 2023-02-19 DIAGNOSIS — G478 Other sleep disorders: Secondary | ICD-10-CM

## 2023-02-19 DIAGNOSIS — R0683 Snoring: Secondary | ICD-10-CM

## 2023-02-19 DIAGNOSIS — M79672 Pain in left foot: Secondary | ICD-10-CM | POA: Diagnosis not present

## 2023-02-20 NOTE — Procedures (Signed)
   [facility]  HOME SLEEP TEST (Watch PAT) REPORT  STUDY DATE: 02/19/2023  DOB: 1970/10/22  MRN: [id number]  ORDERING CLINICIAN: Huston Foley, MD, PhD   REFERRING CLINICIAN: Wanda Plump, MD   CLINICAL INFORMATION/HISTORY: 52 year old female with an underlying medical history of allergies, enlarged thyroid, and headaches, who reports snoring and difficulty maintaining sleep for the past couple of years.  She has non-restorative sleep.    Epworth sleepiness score: 5/24.  BMI: 24.1 kg/m  FINDINGS:   Sleep Summary:   Total Recording Time (hours, min): 7 hours, 37 min  Total Sleep Time (hours, min):  6 hours, 40 min  Percent REM (%):    27.7%   Respiratory Indices:   Calculated pAHI (per hour):  4.5/hour         REM pAHI:    9.1/hour       NREM pAHI: 2.8/hour  Central pAHI: 0/hour  Oxygen Saturation Statistics:    Oxygen Saturation (%) Mean: 95%   Minimum oxygen saturation (%):                 86%   O2 Saturation Range (%): 86 - 99%    O2 Saturation (minutes) <=88%: 0.1 min  Pulse Rate Statistics:   Pulse Mean (bpm):    66/min    Pulse Range (56 - 104/min)   IMPRESSION: Primary snoring   RECOMMENDATION:  This home sleep test does not demonstrate any significant obstructive or central sleep disordered breathing with a total AHI of less than 5/hour.  Her total AHI was 4.5/h, O2 nadir 86% with intermittent snoring detected, mostly in the mild range, rarely in the moderate range. Treatment with a positive airway pressure device such as AutoPap or CPAP is not indicated based on this test. Snoring may improve with avoidance of the supine sleep position. For disturbing snoring, an oral appliance through dentistry or orthodontics can be considered.  Other causes of the patient's symptoms, including circadian rhythm disturbances, an underlying mood disorder, medication effect and/or an underlying medical problem cannot be ruled out based on this test.  Clinical correlation is recommended.  The patient should be cautioned not to drive, work at heights, or operate dangerous or heavy equipment when tired or sleepy. Review and reiteration of good sleep hygiene measures should be pursued with any patient. The patient will be advised to follow up with her referring provider, who will be notified of the test results.   I certify that I have reviewed the raw data recording prior to the issuance of this report in accordance with the standards of the American Academy of Sleep Medicine (AASM).    INTERPRETING PHYSICIAN:   Huston Foley, MD, PhD Medical Director, [facility]) Diplomat, ABPN (Neurology and Sleep)   [facility] [address], [address] [place], [place] [address] [phone]

## 2023-02-20 NOTE — Progress Notes (Signed)
See procedure note.

## 2023-03-11 DIAGNOSIS — M542 Cervicalgia: Secondary | ICD-10-CM | POA: Diagnosis not present

## 2023-03-11 DIAGNOSIS — M79672 Pain in left foot: Secondary | ICD-10-CM | POA: Diagnosis not present

## 2023-04-15 DIAGNOSIS — M79672 Pain in left foot: Secondary | ICD-10-CM | POA: Diagnosis not present

## 2023-04-15 DIAGNOSIS — M542 Cervicalgia: Secondary | ICD-10-CM | POA: Diagnosis not present

## 2023-06-10 DIAGNOSIS — M542 Cervicalgia: Secondary | ICD-10-CM | POA: Diagnosis not present

## 2023-06-10 DIAGNOSIS — M545 Low back pain, unspecified: Secondary | ICD-10-CM | POA: Diagnosis not present

## 2023-06-10 DIAGNOSIS — M79672 Pain in left foot: Secondary | ICD-10-CM | POA: Diagnosis not present

## 2023-06-12 ENCOUNTER — Encounter: Payer: BC Managed Care – PPO | Admitting: Internal Medicine

## 2023-06-12 DIAGNOSIS — M79672 Pain in left foot: Secondary | ICD-10-CM | POA: Diagnosis not present

## 2023-06-12 DIAGNOSIS — M545 Low back pain, unspecified: Secondary | ICD-10-CM | POA: Diagnosis not present

## 2023-06-12 DIAGNOSIS — M542 Cervicalgia: Secondary | ICD-10-CM | POA: Diagnosis not present

## 2023-06-19 DIAGNOSIS — M545 Low back pain, unspecified: Secondary | ICD-10-CM | POA: Diagnosis not present

## 2023-06-19 DIAGNOSIS — M542 Cervicalgia: Secondary | ICD-10-CM | POA: Diagnosis not present

## 2023-06-19 DIAGNOSIS — M79672 Pain in left foot: Secondary | ICD-10-CM | POA: Diagnosis not present

## 2023-06-24 DIAGNOSIS — M542 Cervicalgia: Secondary | ICD-10-CM | POA: Diagnosis not present

## 2023-06-24 DIAGNOSIS — M79672 Pain in left foot: Secondary | ICD-10-CM | POA: Diagnosis not present

## 2023-06-24 DIAGNOSIS — M545 Low back pain, unspecified: Secondary | ICD-10-CM | POA: Diagnosis not present

## 2023-06-26 DIAGNOSIS — M79672 Pain in left foot: Secondary | ICD-10-CM | POA: Diagnosis not present

## 2023-06-26 DIAGNOSIS — M542 Cervicalgia: Secondary | ICD-10-CM | POA: Diagnosis not present

## 2023-06-26 DIAGNOSIS — M545 Low back pain, unspecified: Secondary | ICD-10-CM | POA: Diagnosis not present

## 2023-07-02 DIAGNOSIS — M542 Cervicalgia: Secondary | ICD-10-CM | POA: Diagnosis not present

## 2023-07-02 DIAGNOSIS — M79672 Pain in left foot: Secondary | ICD-10-CM | POA: Diagnosis not present

## 2023-07-02 DIAGNOSIS — M545 Low back pain, unspecified: Secondary | ICD-10-CM | POA: Diagnosis not present

## 2023-07-11 DIAGNOSIS — M545 Low back pain, unspecified: Secondary | ICD-10-CM | POA: Diagnosis not present

## 2023-07-11 DIAGNOSIS — M79672 Pain in left foot: Secondary | ICD-10-CM | POA: Diagnosis not present

## 2023-07-11 DIAGNOSIS — M542 Cervicalgia: Secondary | ICD-10-CM | POA: Diagnosis not present

## 2023-07-22 ENCOUNTER — Encounter: Payer: Self-pay | Admitting: Internal Medicine

## 2023-07-22 ENCOUNTER — Ambulatory Visit (INDEPENDENT_AMBULATORY_CARE_PROVIDER_SITE_OTHER): Payer: BC Managed Care – PPO | Admitting: Internal Medicine

## 2023-07-22 VITALS — BP 108/64 | HR 58 | Temp 98.0°F | Resp 16 | Ht 69.0 in | Wt 166.4 lb

## 2023-07-22 DIAGNOSIS — Z0000 Encounter for general adult medical examination without abnormal findings: Secondary | ICD-10-CM | POA: Diagnosis not present

## 2023-07-22 NOTE — Progress Notes (Unsigned)
 Subjective:    Patient ID: Ruth Tucker, female    DOB: 1970/10/16, 53 y.o.   MRN: [id number]  DOS:  07/22/2023 Type of visit - description: CPX  CPX. In general feels well other than some fatigue and some insomnia.   Review of Systems See above   Past Medical History:  Diagnosis Date   COVID 05/30/2020   FH: colon polyps    Thyroid disease    hx enlarged thyroid--US normal   Varicella     Past Surgical History:  Procedure Laterality Date   adenitis mesenterica, 3 laparoscopies  2003   3 surgeries ([place])--   CESAREAN SECTION     2005 and 2008   PELVIC LAPAROSCOPY     uterine polyp removal ([place])      Current Outpatient Medications  Medication Instructions   dutasteride (AVODART) 0.5 mg, Daily   OVER THE COUNTER MEDICATION Citoneurin 5000   psyllium (METAMUCIL) 58.6 % packet 1 packet, Oral, As needed   Testosterone Cypionate POWD by Does not apply route. 7mg  daily       Objective:   Physical Exam BP 108/64   Pulse (!) 58   Temp 98 F (36.7 C) (Oral)   Resp 16   Ht 5\' 9"  (1.753 m)   Wt 166 lb 6 oz (75.5 kg)   LMP 07/14/2023 (Exact Date)   BMI 24.57 kg/m  General: Well developed, NAD, BMI noted Neck: No  thyromegaly  HEENT:  Normocephalic . Face symmetric, atraumatic Lungs:  CTA B Normal respiratory effort, no intercostal retractions, no accessory muscle use. Heart: RRR,  no murmur.  Abdomen:  Not distended, soft, non-tender. No rebound or rigidity.   Lower extremities: no pretibial edema bilaterally  Skin: Exposed areas without rash. Not pale. Not jaundice Neurologic:  alert & oriented X3.  Speech normal, gait appropriate for age and unassisted Strength symmetric and appropriate for age.  Psych: Cognition and judgment appear intact.  Cooperative with normal attention span and concentration.  Behavior appropriate. No anxious or depressed appearing.     Assessment   Assessment (new patient 03-2020) US Thyroid 04-2020 wnl Chronic  constipation Snoring: Sleep study w/ no  OSA.  October 2024  PLAN  Here for CPX Tdap:2021 Rec flu shot q fall.covid vax. Also think about shingrix  Female care per Dr. Edward Jolly, also sees a gyn in [place]. Got a letter about next MMG, rec to proceed  CCS: C-scope 2016 while she was living in [place], it was done after infection (colitis?),  + FH (aunt with colon cancer).C-scope 04-2021, WNL 10 years per report   Labs: Reviewed, will check a CMP FLP CBC Lifestyle is very healt, exercises regularly. Other issues: Fatigue, had some fatigue, went to see a gynecologist in [place], prescribed testosterone injection.  Advised patient, monitoring of this treatment is to be done  by the prescribing MD. Alopecia: Also taking minoxidil and Avodart prescribed by a physician in [place]. Snoring: Negative for sleep study October 2024. RTC 1 year      Headache: See LOV, CT showed no acute intercranial normality but some sinus disease, she eventually got a round of prednisone and Zithromax which quickly improved her symptoms. Is her observation that as long as she control her sinus congestion with Flonase she is headache free.  Plan: Continue Flonase. Allergies: See above Weight gain: Reports no change in diet, + weight gain, she thinks it may be related to menopause?  plans to see gynecology soon.  Recent TSH and A1c  normal. Insomnia: Continue with insomnia, minimal snoring, some fatigue.  Was referred to neurology, encouraged to call them for further eval. Vaccine advice provided. RTC CPX 05-2023

## 2023-07-22 NOTE — Patient Instructions (Signed)
 Vaccines you may like to consider: A flu shot every fall COVID booster Shingrix  GO TO THE LAB : Get the blood work     Please go to the front desk and schedule the following: Complete physical exam in 1 year

## 2023-07-23 ENCOUNTER — Encounter: Payer: Self-pay | Admitting: Internal Medicine

## 2023-07-23 LAB — CBC WITH DIFFERENTIAL/PLATELET
Basophils Absolute: 0.1 10*3/uL (ref 0.0–0.1)
Basophils Relative: 1.5 % (ref 0.0–3.0)
Eosinophils Absolute: 0.3 10*3/uL (ref 0.0–0.7)
Eosinophils Relative: 5 % (ref 0.0–5.0)
HCT: 38.5 % (ref 36.0–46.0)
Hemoglobin: 12.7 g/dL (ref 12.0–15.0)
Lymphocytes Relative: 36.4 % (ref 12.0–46.0)
Lymphs Abs: 2.1 10*3/uL (ref 0.7–4.0)
MCHC: 33.1 g/dL (ref 30.0–36.0)
MCV: 93.6 fl (ref 78.0–100.0)
Monocytes Absolute: 0.4 10*3/uL (ref 0.1–1.0)
Monocytes Relative: 7.7 % (ref 3.0–12.0)
Neutro Abs: 2.8 10*3/uL (ref 1.4–7.7)
Neutrophils Relative %: 49.4 % (ref 43.0–77.0)
Platelets: 240 10*3/uL (ref 150.0–400.0)
RBC: 4.11 Mil/uL (ref 3.87–5.11)
RDW: 13.1 % (ref 11.5–15.5)
WBC: 5.6 10*3/uL (ref 4.0–10.5)

## 2023-07-23 LAB — COMPREHENSIVE METABOLIC PANEL
ALT: 14 U/L (ref 0–35)
AST: 20 U/L (ref 0–37)
Albumin: 4.3 g/dL (ref 3.5–5.2)
Alkaline Phosphatase: 46 U/L (ref 39–117)
BUN: 11 mg/dL (ref 6–23)
CO2: 31 meq/L (ref 19–32)
Calcium: 9.2 mg/dL (ref 8.4–10.5)
Chloride: 102 meq/L (ref 96–112)
Creatinine, Ser: 0.86 mg/dL (ref 0.40–1.20)
GFR: 78.54 mL/min (ref >60.00)
Glucose, Bld: 89 mg/dL (ref 70–99)
Potassium: 4.2 meq/L (ref 3.5–5.1)
Sodium: 138 meq/L (ref 135–145)
Total Bilirubin: 0.4 mg/dL (ref 0.2–1.2)
Total Protein: 6.5 g/dL (ref 6.0–8.3)

## 2023-07-23 LAB — LIPID PANEL
Cholesterol: 175 mg/dL (ref 0–200)
HDL: 70.1 mg/dL (ref >39.00)
LDL Cholesterol: 95 mg/dL (ref 0–99)
NonHDL: 105.05
Total CHOL/HDL Ratio: 2
Triglycerides: 49 mg/dL (ref 0.0–149.0)
VLDL: 9.8 mg/dL (ref 0.0–40.0)

## 2023-07-23 NOTE — Assessment & Plan Note (Signed)
 Here for CPX Tdap:2021 Rec flu shot q fall.covid vax. Also could consider shingrix  Female care per Dr. Edward Jolly, also sees a gyn in [place]. Due for a MMG, already got a letter about it, rec to proceed  CCS: C-scope 2016 while she was living in [place], it was done after infection (colitis?),  + FH (aunt with colon cancer).C-scope 04-2021, WNL 10 years per report Labs: Reviewed, will check a CMP FLP CBC Lifestyle is very healt, exercises regularly.

## 2023-07-23 NOTE — Assessment & Plan Note (Signed)
 Here for CPX   Other issues: Fatigue, had some fatigue, went to see a gynecologist in [place], prescribed testosterone injections.  Advised patient, monitoring of this treatment is to be done  by the prescribing MD. Alopecia: Also taking minoxidil and Avodart prescribed by a physician in [place]. Snoring: Negative for sleep study October 2024. RTC 1 year

## 2023-07-26 ENCOUNTER — Encounter: Payer: Self-pay | Admitting: Internal Medicine

## 2023-09-12 ENCOUNTER — Encounter: Payer: Self-pay | Admitting: Internal Medicine

## 2023-11-11 DIAGNOSIS — R232 Flushing: Secondary | ICD-10-CM | POA: Diagnosis not present

## 2023-11-11 DIAGNOSIS — L708 Other acne: Secondary | ICD-10-CM | POA: Diagnosis not present

## 2023-11-11 DIAGNOSIS — M2550 Pain in unspecified joint: Secondary | ICD-10-CM | POA: Diagnosis not present

## 2023-11-11 DIAGNOSIS — R4586 Emotional lability: Secondary | ICD-10-CM | POA: Diagnosis not present

## 2023-12-02 DIAGNOSIS — G479 Sleep disorder, unspecified: Secondary | ICD-10-CM | POA: Diagnosis not present

## 2024-01-02 DIAGNOSIS — R232 Flushing: Secondary | ICD-10-CM | POA: Diagnosis not present

## 2024-01-02 DIAGNOSIS — N898 Other specified noninflammatory disorders of vagina: Secondary | ICD-10-CM | POA: Diagnosis not present

## 2024-01-02 DIAGNOSIS — R6882 Decreased libido: Secondary | ICD-10-CM | POA: Diagnosis not present

## 2024-01-02 DIAGNOSIS — N951 Menopausal and female climacteric states: Secondary | ICD-10-CM | POA: Diagnosis not present

## 2024-01-02 NOTE — Progress Notes (Deleted)
 53 y.o. G110P0012 Married Hispanic female here for annual exam.    PCP: Amon Aloysius BRAVO, MD   No LMP recorded.           Sexually active: Yes.    The current method of family planning is none.    Menopausal hormone therapy:  n/a Exercising: {yes no:314532}  {types:19826} Smoker:  no  OB History  Gravida Para Term Preterm AB Living  3 2   1 2   SAB IAB Ectopic Multiple Live Births  1        # Outcome Date GA Lbr Len/2nd Weight Sex Type Anes PTL Lv  3 SAB           2 Para      CS-Classical     1 Para      CS-Classical        HEALTH MAINTENANCE: Last 2 paps:  10/03/21 neg HR HPV neg History of abnormal Pap or positive HPV:  no Mammogram:   06/12/22 Breast Density Cat C, BIRADS Cat 1 neg  Colonoscopy:  05/10/21 Bone Density:  n/a  Result  n/a   Immunization History  Administered Date(s) Administered   Influenza Split 03/24/2020   Influenza,inj,Quad PF,6+ Mos 05/29/2022   Influenza-Unspecified 03/21/2021   PFIZER(Purple Top)SARS-COV-2 Vaccination 08/04/2019, 08/25/2019, 03/24/2020   Pfizer Covid-19 Vaccine Bivalent Booster 45yrs & up 05/01/2021   Tdap 04/19/2020      reports that she has never smoked. She has never used smokeless tobacco. She reports current alcohol use of about 2.0 standard drinks of alcohol per week. She reports that she does not use drugs.  Past Medical History:  Diagnosis Date   COVID 05/30/2020   FH: colon polyps    Thyroid  disease    hx enlarged thyroid --US  normal   Varicella     Past Surgical History:  Procedure Laterality Date   adenitis mesenterica, 3 laparoscopies  2003   3 surgeries ([place])--   CESAREAN SECTION     2005 and 2008   PELVIC LAPAROSCOPY     uterine polyp removal ([place])      Current Outpatient Medications  Medication Sig Dispense Refill   dutasteride (AVODART) 0.5 MG capsule Take 0.5 mg by mouth daily.     minoxidil (LONITEN) 2.5 MG tablet Take 2.5 mg by mouth daily.     OVER THE COUNTER MEDICATION Citoneurin 5000      psyllium (METAMUCIL) 58.6 % packet Take 1 packet by mouth as needed.     Testosterone  Cypionate POWD by Does not apply route. 7mg  daily     No current facility-administered medications for this visit.    ALLERGIES: Patient has no known allergies.  Family History  Problem Relation Age of Onset   Depression Mother    Hypertension Mother    Sleep apnea Mother    Asthma Sister    Hypertension Brother    Colon cancer Maternal Aunt    Breast cancer Paternal Aunt    Colon cancer Paternal Aunt    Cancer Maternal Grandmother    Hypertension Maternal Grandmother    Cancer Maternal Grandfather    Hypertension Maternal Grandfather    Hypertension Paternal Grandmother    Cancer Paternal Grandfather    Hypertension Paternal Grandfather    Hearing loss Son    Colon cancer Other        aunt    Breast cancer Other        aunt   Cancer Other  Ovarian ca   Ovarian cancer Other    Esophageal cancer Neg Hx    Rectal cancer Neg Hx    Stomach cancer Neg Hx     Review of Systems  PHYSICAL EXAM:  There were no vitals taken for this visit.    General appearance: alert, cooperative and appears stated age Head: normocephalic, without obvious abnormality, atraumatic Neck: no adenopathy, supple, symmetrical, trachea midline and thyroid  normal to inspection and palpation Lungs: clear to auscultation bilaterally Breasts: normal appearance, no masses or tenderness, No nipple retraction or dimpling, No nipple discharge or bleeding, No axillary adenopathy Heart: regular rate and rhythm Abdomen: soft, non-tender; no masses, no organomegaly Extremities: extremities normal, atraumatic, no cyanosis or edema Skin: skin color, texture, turgor normal. No rashes or lesions Lymph nodes: cervical, supraclavicular, and axillary nodes normal. Neurologic: grossly normal  Pelvic: External genitalia:  no lesions              No abnormal inguinal nodes palpated.              Urethra:  normal appearing  urethra with no masses, tenderness or lesions              Bartholins and Skenes: normal                 Vagina: normal appearing vagina with normal color and discharge, no lesions              Cervix: no lesions              Pap taken: {yes no:314532} Bimanual Exam:  Uterus:  normal size, contour, position, consistency, mobility, non-tender              Adnexa: no mass, fullness, tenderness              Rectal exam: {yes no:314532}.  Confirms.              Anus:  normal sphincter tone, no lesions  Chaperone was present for exam:  {BSCHAPERONE:31226::Emily F, CMA}  ASSESSMENT: Well woman visit with gynecologic exam.  PHQ-2-9: ***  ***  PLAN: Mammogram screening discussed. Self breast awareness reviewed. Pap and HRV collected:  {yes no:314532} Guidelines for Calcium, Vitamin D, regular exercise program including cardiovascular and weight bearing exercise. Medication refills:  *** {LABS (Optional):23779} Follow up:  ***    Additional counseling given.  {yes [id number]. ***  total time was spent for this patient encounter, including preparation, face-to-face counseling with the patient, coordination of care, and documentation of the encounter in addition to doing the well woman visit with gynecologic exam.

## 2024-01-06 ENCOUNTER — Ambulatory Visit: Payer: BC Managed Care – PPO | Admitting: Obstetrics and Gynecology

## 2024-04-27 ENCOUNTER — Encounter: Payer: Self-pay | Admitting: Family Medicine

## 2024-04-27 ENCOUNTER — Ambulatory Visit: Admitting: Family Medicine

## 2024-04-27 VITALS — BP 122/62 | HR 62 | Temp 98.5°F | Ht 69.0 in | Wt 167.0 lb

## 2024-04-27 DIAGNOSIS — R5383 Other fatigue: Secondary | ICD-10-CM

## 2024-04-27 DIAGNOSIS — Z7689 Persons encountering health services in other specified circumstances: Secondary | ICD-10-CM

## 2024-04-27 DIAGNOSIS — L659 Nonscarring hair loss, unspecified: Secondary | ICD-10-CM

## 2024-04-27 DIAGNOSIS — G4700 Insomnia, unspecified: Secondary | ICD-10-CM | POA: Diagnosis not present

## 2024-04-27 DIAGNOSIS — Z1231 Encounter for screening mammogram for malignant neoplasm of breast: Secondary | ICD-10-CM | POA: Diagnosis not present

## 2024-04-27 DIAGNOSIS — Z23 Encounter for immunization: Secondary | ICD-10-CM | POA: Diagnosis not present

## 2024-04-27 MED ORDER — TRAZODONE HCL 50 MG PO TABS: 50.0000 mg | ORAL_TABLET | Freq: Every day | ORAL | 1 refills | Status: AC

## 2024-04-27 NOTE — Progress Notes (Signed)
 Patient Office Visit  Assessment & Plan:  Encounter to establish care  Alopecia -     TSH  Insomnia, unspecified type -     traZODone  HCl; Take 1 tablet (50 mg total) by mouth at bedtime. Take one hour prior to bedtime  Dispense: 90 tablet; Refill: 1  Other fatigue -     CBC with Differential/Platelet -     TSH -     VITAMIN D  25 Hydroxy (Vit-D Deficiency, Fractures) -     Vitamin B12 -     Iron, TIBC and Ferritin Panel -     Comprehensive metabolic panel with GFR  Screening mammogram for breast cancer -     3D Screening Mammogram, Left and Right; Future  Need for immunization against influenza -     Flu vaccine trivalent PF, 6mos and older(Flulaval,Afluria,Fluarix,Fluzone)   Assessment and Plan        Test results were reviewed and analyzed as part of the medical decision making of this visit.  Reviewed notes and laboratory done with Dr. Amon previously. Recommend healthy diet i.e mediterranean/DASH diet, consistent exercise - 30 minutes 5 day per week, and gradual weight loss. Flu shot given today.  Patient will get the Shingrix next time.  Follow-up on lab work notify patient.  3D mammogram ordered to breast center in [place].   Insomnia Chronic insomnia with frequent nocturnal awakenings causing daytime fatigue. Addressed concerns about medication addiction, reassuring about non-addictive options. - Prescribed trazodone  50 mg one hour before bedtime. - Advised nightly use of trazodone  for consistent sleep improvement. - Discussed non-addictive sleep aids such as trazodone , doxepin, and Elavil, other options would be Lunesta/ambien - not preferred.  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 Current treatment with doxepin and minoxidil effective. - Continue finasteride equivalent and minoxidil. - Advised annual dermatology follow-up in [place]. May want to establish with Dermatologist in [place]  - ie [facility]  Fatigue Chronic fatigue likely related to insomnia and excessive caffeine intake. High coffee consumption may contribute to sleep disturbances. - Advised reducing caffeine intake, avoiding coffee after 2 PM. - Encouraged regular exercise, such as ballet, to improve energy levels.  General Health Maintenance Routine health maintenance discussed. Up to date with cholesterol and liver function tests. Slightly elevated fructosamine, normal glucose. Due for mammogram and colonoscopy. - Administered influenza vaccine. - Recommended scheduling a mammogram at the [facility] in [place], ordered today - Advised scheduling a colonoscopy when she is due - not yet - Discussed importance of regular health screenings and vaccinations.     Test results were reviewed and analyzed as part of the medical decision making of this visit.  Reviewed notes and laboratory done with Dr. Amon previously. Recommend healthy diet i.e mediterranean/DASH diet, consistent exercise - 30 minutes 5 day per week, and gradual weight loss. Flu shot given today.  Patient will get the Shingrix next time.  Follow-up on lab work notify patient.  3D mammogram ordered to breast center in [place]. Patient will take the trazodone  50mg  1 hour prior to bedtime.  Patient is aware that she can start at 25 mg initially and then go up to 50 mg.  Patient is also aware that she may need to increase to 100 mg at nighttime. Return in 3 months (on 07/26/2024), or if symptoms worsen or fail to improve, for physical.   Subjective:    Patient ID: Ruth Tucker, female    DOB: 04-Nov-1970  Age: 53 y.o.  MRN: [id number]  Chief Complaint  Patient presents with   Medical Management of Chronic Issues   Establish Care    HPI Discussed the use of AI scribe software for clinical note transcription with the patient, who gave verbal consent to proceed.  History of Present Illness       History of Present Illness Ruth Tucker is a  53 year old female who presents with sleep disturbances and weight gain, also to establish primary care here.  She experiences significant sleep disturbances, characterized by falling asleep quickly but waking multiple times during the night. On good nights, she wakes up three times, and on bad nights, up to ten times, leading to morning fatigue and sometimes dark circles under her eyes. She has tried magnesium and melatonin with varying success and is concerned about becoming dependent on sleep medications. She has not tried any Rx sleep aids.   She has gained approximately twelve kilograms since arriving in the [place] . Blood tests for life insurance purposes showed normal cholesterol and liver function, but slightly elevated fructosamine levels. No illegal drug use is reported.  She has alopecia, specifically 'alopecia', with hair loss on the sides of her head, half of her eyebrows, and most body hair. She has been on treatment with minoxidil and dutasteride for almost a year per [place] dermatologist which has made a significant difference. She also uses DIM, a supplement from [place], and is under the care of a dermatologist specializing in hair in [place].  Her social history includes a high intake of coffee, consuming five to eight cups per day, which she is trying to reduce by having her last cup by 2 PM. She practices ballet as a form of exercise, does yoga and goes to gym and has a history of living in various countries, including [place] and [place]. Patient was interested in doing hormone levels or menopausal labs however patient is still having regular periods and is not postmenopausal.  Patient may be perimenopausal at this time.  Patient does have an OB/GYN however patient believes that she is more hesitant about prescribing hormones.  Patient does feel that she does have brain fog. She has not had a mammogram this year but had one previously, which required follow-up with an  ultrasound. She had a colonoscopy in 2022, which was normal. She is interested in receiving the flu vaccine and has not yet received the shingles vaccine.  Results LABS Cholesterol: Normal (03/2024) Fructosamine: Elevated (03/2024) Potassium: Normal (03/2024) Liver function tests: Normal (03/2024) HIV: Negative (03/2024) Toxicology urine: Negative (03/2024)  DIAGNOSTIC Colonoscopy: Normal (2022)  Assessment and Plan Insomnia Chronic insomnia with frequent nocturnal awakenings causing daytime fatigue. Addressed concerns about medication addiction, reassuring about non-addictive options. - Prescribed trazodone  50 mg one hour before bedtime. - Advised nightly use of trazodone  for consistent sleep improvement. - Discussed non-addictive sleep aids such as trazodone , doxepin, and Addaprin.  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 Current treatment with doxepin and minoxidil effective. - Continue finasteride equivalent and minoxidil. - Advised annual dermatology follow-up in [place]. May want to establish with Dermatologist in [place]  - ie [facility]  Fatigue Chronic fatigue likely related to insomnia and excessive caffeine intake. High coffee consumption may contribute to sleep disturbances. - Advised reducing caffeine intake, avoiding coffee after 2 PM. - Encouraged regular exercise, such as ballet, to improve energy levels.  General Health Maintenance Routine health maintenance discussed. Up to date with cholesterol and liver function tests. Slightly elevated  fructosamine, normal glucose. Due for mammogram and colonoscopy. - Administered influenza vaccine. - Recommended scheduling a mammogram at the [facility] in [place], ordered today - Advised scheduling a colonoscopy when she is due - not yet - Discussed importance of regular health screenings and vaccinations.    The 10-year ASCVD risk score (Arnett DK, et al., 2019) is:  0.8%  Past Medical History:  Diagnosis Date   COVID 05/30/2020   FH: colon polyps    Thyroid  disease    hx enlarged thyroid --US  normal   Varicella    Past Surgical History:  Procedure Laterality Date   adenitis mesenterica, 3 laparoscopies  2003   3 surgeries ([place])--   CESAREAN SECTION     2005 and 2008   PELVIC LAPAROSCOPY     uterine polyp removal ([place])     Social History   Tobacco Use   Smoking status: Never   Smokeless tobacco: Never  Substance Use Topics   Alcohol use: Yes    Alcohol/week: 2.0 standard drinks of alcohol    Types: 2 Shots of liquor per week    Comment: social   Drug use: Never   Family History  Problem Relation Age of Onset   Depression Mother    Hypertension Mother    Sleep apnea Mother    Asthma Sister    Hypertension Brother    Colon cancer Maternal Aunt    Breast cancer Paternal Aunt    Colon cancer Paternal Aunt    Cancer Maternal Grandmother    Hypertension Maternal Grandmother    Cancer Maternal Grandfather    Hypertension Maternal Grandfather    Hypertension Paternal Grandmother    Cancer Paternal Grandfather    Hypertension Paternal Grandfather    Hearing loss Son    Colon cancer Other        aunt    Breast cancer Other        aunt   Cancer Other        Ovarian ca   Ovarian cancer Other    Esophageal cancer Neg Hx    Rectal cancer Neg Hx    Stomach cancer Neg Hx    No Known Allergies  ROS    Objective:    BP 122/62   Pulse 62   Temp 98.5 F (36.9 C)   Ht 5' 9 (1.753 m)   Wt 167 lb (75.8 kg)   LMP 04/20/2024 (Approximate)   SpO2 98%   BMI 24.66 kg/m  BP Readings from Last 3 Encounters:  04/27/24 122/62  07/22/23 108/64  01/28/23 (!) 99/56   Wt Readings from Last 3 Encounters:  04/27/24 167 lb (75.8 kg)  07/22/23 166 lb 6 oz (75.5 kg)  01/28/23 163 lb (73.9 kg)    Physical Exam Vitals and nursing note reviewed.  Constitutional:      General: She is not in acute distress.    Appearance:  Normal appearance.  HENT:     Head: Normocephalic.     Right Ear: Tympanic membrane, ear canal and external ear normal.     Left Ear: Tympanic membrane, ear canal and external ear normal.  Eyes:     Extraocular Movements: Extraocular movements intact.     Conjunctiva/sclera: Conjunctivae normal.     Pupils: Pupils are equal, round, and reactive to light.  Cardiovascular:     Rate and Rhythm: Normal rate and regular rhythm.     Heart sounds: Normal heart sounds.  Pulmonary:     Effort: Pulmonary effort is  normal.     Breath sounds: Normal breath sounds.  Musculoskeletal:     Right lower leg: No edema.     Left lower leg: No edema.  Neurological:     General: No focal deficit present.     Mental Status: She is alert and oriented to person, place, and time.  Psychiatric:        Mood and Affect: Mood normal.        Behavior: Behavior normal.        Thought Content: Thought content normal.        Judgment: Judgment normal.      No results found for any visits on 04/27/24.

## 2024-04-28 ENCOUNTER — Ambulatory Visit

## 2024-04-28 ENCOUNTER — Ambulatory Visit
Admission: RE | Admit: 2024-04-28 | Discharge: 2024-04-28 | Disposition: A | Source: Ambulatory Visit | Attending: Family Medicine | Admitting: Family Medicine

## 2024-04-28 ENCOUNTER — Ambulatory Visit: Payer: Self-pay | Admitting: Family Medicine

## 2024-04-28 DIAGNOSIS — Z1231 Encounter for screening mammogram for malignant neoplasm of breast: Secondary | ICD-10-CM

## 2024-04-28 LAB — IRON,TIBC AND FERRITIN PANEL
%SAT: 26 % (ref 16–45)
Ferritin: 18 ng/mL (ref 16–232)
Iron: 94 ug/dL (ref 45–160)
TIBC: 360 ug/dL (ref 250–450)

## 2024-04-28 LAB — COMPREHENSIVE METABOLIC PANEL WITH GFR
AG Ratio: 2.1 (calc) (ref 1.0–2.5)
ALT: 6 U/L (ref 6–29)
AST: 16 U/L (ref 10–35)
Albumin: 4.6 g/dL (ref 3.6–5.1)
Alkaline phosphatase (APISO): 48 U/L (ref 37–153)
BUN: 9 mg/dL (ref 7–25)
CO2: 27 mmol/L (ref 20–32)
Calcium: 9.4 mg/dL (ref 8.6–10.4)
Chloride: 104 mmol/L (ref 98–110)
Creat: 0.65 mg/dL (ref 0.50–1.03)
Globulin: 2.2 g/dL (ref 1.9–3.7)
Glucose, Bld: 94 mg/dL (ref 65–99)
Potassium: 4.2 mmol/L (ref 3.5–5.3)
Sodium: 140 mmol/L (ref 135–146)
Total Bilirubin: 0.5 mg/dL (ref 0.2–1.2)
Total Protein: 6.8 g/dL (ref 6.1–8.1)
eGFR: 107 mL/min/1.73m2 (ref >=60)

## 2024-04-28 LAB — CBC WITH DIFFERENTIAL/PLATELET
Absolute Lymphocytes: 1849 {cells}/uL (ref 850–3900)
Absolute Monocytes: 350 {cells}/uL (ref 200–950)
Basophils Absolute: 51 {cells}/uL (ref 0–200)
Basophils Relative: 1.1 %
Eosinophils Absolute: 239 {cells}/uL (ref 15–500)
Eosinophils Relative: 5.2 %
HCT: 40.7 % (ref 35.9–46.0)
Hemoglobin: 13.2 g/dL (ref 11.7–15.5)
MCH: 30.3 pg (ref 27.0–33.0)
MCHC: 32.4 g/dL (ref 31.6–35.4)
MCV: 93.6 fL (ref 81.4–101.7)
MPV: 10.4 fL (ref 7.5–12.5)
Monocytes Relative: 7.6 %
Neutro Abs: 2111 {cells}/uL (ref 1500–7800)
Neutrophils Relative %: 45.9 %
Platelets: 309 Thousand/uL (ref 140–400)
RBC: 4.35 Million/uL (ref 3.80–5.10)
RDW: 11.8 % (ref 11.0–15.0)
Total Lymphocyte: 40.2 %
WBC: 4.6 Thousand/uL (ref 3.8–10.8)

## 2024-04-28 LAB — VITAMIN B12: Vitamin B-12: 349 pg/mL (ref 200–1100)

## 2024-04-28 LAB — TSH: TSH: 0.83 m[IU]/L

## 2024-04-28 LAB — VITAMIN D 25 HYDROXY (VIT D DEFICIENCY, FRACTURES): Vit D, 25-Hydroxy: 37 ng/mL (ref 30–100)

## 2024-06-01 ENCOUNTER — Ambulatory Visit

## 2024-07-22 ENCOUNTER — Encounter: Admitting: Internal Medicine

## 2024-08-11 ENCOUNTER — Encounter: Admitting: Family Medicine

## 2024-08-11 ENCOUNTER — Encounter: Admitting: Internal Medicine

## 2024-08-11 ENCOUNTER — Ambulatory Visit: Admitting: Obstetrics and Gynecology
